# Patient Record
Sex: Female | Born: 1940 | Race: White | Hispanic: No | Marital: Married | Smoking: Never smoker
Health system: Southern US, Community
[De-identification: ages and names within clinical notes are randomized; demographics above are authoritative.]

## PROBLEM LIST (undated history)

## (undated) DIAGNOSIS — I480 Paroxysmal atrial fibrillation: Secondary | ICD-10-CM

## (undated) DIAGNOSIS — N189 Chronic kidney disease, unspecified: Secondary | ICD-10-CM

## (undated) DIAGNOSIS — K219 Gastro-esophageal reflux disease without esophagitis: Secondary | ICD-10-CM

## (undated) DIAGNOSIS — G4733 Obstructive sleep apnea (adult) (pediatric): Secondary | ICD-10-CM

## (undated) DIAGNOSIS — H269 Unspecified cataract: Secondary | ICD-10-CM

## (undated) DIAGNOSIS — C801 Malignant (primary) neoplasm, unspecified: Secondary | ICD-10-CM

## (undated) DIAGNOSIS — G629 Polyneuropathy, unspecified: Secondary | ICD-10-CM

## (undated) DIAGNOSIS — M1990 Unspecified osteoarthritis, unspecified site: Secondary | ICD-10-CM

## (undated) DIAGNOSIS — I7300 Raynaud's syndrome without gangrene: Secondary | ICD-10-CM

## (undated) DIAGNOSIS — I10 Essential (primary) hypertension: Secondary | ICD-10-CM

## (undated) HISTORY — PX: CATARACT EXTRACTION, BILATERAL: SHX1313

## (undated) HISTORY — DX: Polyneuropathy, unspecified: G62.9

## (undated) HISTORY — DX: Paroxysmal atrial fibrillation: I48.0

## (undated) HISTORY — DX: Unspecified cataract: H26.9

## (undated) HISTORY — DX: Essential (primary) hypertension: I10

## (undated) HISTORY — PX: ABDOMINAL HYSTERECTOMY: SHX81

## (undated) HISTORY — DX: Obstructive sleep apnea (adult) (pediatric): G47.33

## (undated) HISTORY — DX: Raynaud's syndrome without gangrene: I73.00

---

## 2010-11-18 HISTORY — PX: OTHER SURGICAL HISTORY: SHX169

## 2011-12-08 ENCOUNTER — Other Ambulatory Visit: Payer: Self-pay | Admitting: Diagnostic Neuroimaging

## 2011-12-08 DIAGNOSIS — M545 Low back pain: Secondary | ICD-10-CM

## 2011-12-08 DIAGNOSIS — R200 Anesthesia of skin: Secondary | ICD-10-CM

## 2011-12-08 DIAGNOSIS — G629 Polyneuropathy, unspecified: Secondary | ICD-10-CM

## 2011-12-20 ENCOUNTER — Ambulatory Visit
Admission: RE | Admit: 2011-12-20 | Discharge: 2011-12-20 | Disposition: A | Discharge status: Home / Self Care | Payer: PRIVATE HEALTH INSURANCE | Source: Ambulatory Visit | Attending: Diagnostic Neuroimaging | Admitting: Diagnostic Neuroimaging

## 2011-12-20 DIAGNOSIS — M545 Low back pain: Secondary | ICD-10-CM

## 2011-12-20 DIAGNOSIS — R200 Anesthesia of skin: Secondary | ICD-10-CM

## 2011-12-20 DIAGNOSIS — G629 Polyneuropathy, unspecified: Secondary | ICD-10-CM

## 2012-09-27 ENCOUNTER — Encounter: Payer: Self-pay | Admitting: Cardiovascular Disease

## 2012-09-27 ENCOUNTER — Ambulatory Visit (INDEPENDENT_AMBULATORY_CARE_PROVIDER_SITE_OTHER): Payer: Medicare Other | Admitting: Cardiovascular Disease

## 2012-09-27 VITALS — BP 160/80 | HR 47 | Ht 68.0 in | Wt 208.0 lb

## 2012-09-27 DIAGNOSIS — I480 Paroxysmal atrial fibrillation: Secondary | ICD-10-CM

## 2012-09-27 DIAGNOSIS — I4891 Unspecified atrial fibrillation: Secondary | ICD-10-CM

## 2012-09-27 DIAGNOSIS — I10 Essential (primary) hypertension: Secondary | ICD-10-CM

## 2012-09-27 MED ORDER — WARFARIN SODIUM 5 MG PO TABS: 5.0000 mg | ORAL_TABLET | Freq: Every day | ORAL | Status: DC

## 2012-09-27 NOTE — Patient Instructions (Addendum)
  We will see you back in follow up after the tests  Dr Allyson Sabal has ordered an echocardiogram and lexiscan myoview  INR on Tuesday 7/22  Start Coumadin 5mg  daily

## 2012-09-27 NOTE — Assessment & Plan Note (Signed)
The patient has had palpitations for several years. She's become a multipack process one or 2 years ago. Her A. Fib was found on Holter monitor. She gets occasional episodes of PAF several times a month with associated substernal chest pressure. She had a negative stress test several years ago and an ultrasound of her heart that showed mild pulmonary hypertension and left atrial dilatation. This was done at [facility] in 2011 I'm going to repeat a 2-D echo and alexic in my view. Her CHA2DS2VASC score is 3 making her a good candidate for oral anticoagulation.

## 2012-09-27 NOTE — Progress Notes (Signed)
09/27/2012 Ebony Mason   June 09, 1940  [id number]  Primary Physician Junius Roads, MD Primary Cardiologist: [name] Roseanne Reno   HPI:  Ms. Dorrance is a 72 year old married Caucasian female mother of 2, grandmother to 2 crit grandchildren who was referred to me for the courtesy of Dr. Dorann Lodge in [facility] for evaluation and treatment of paroxysmal atrial fibrillation. 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 Her Percocet to profile is positive only for hypertension. She's never had a heart attack or stroke. She has had pexed paroxysmal A. Fib for several years. She had negative stress test and echo at [facility] 2011 has been multi-for several years but apparently has decreased in frequency and severity of her A. Fib episodes. Her chart to score was thought that one and therefore she was treated only with aspirin. During these episodes she does get substernal chest pressure.   Current Outpatient Prescriptions  Medication Sig Dispense Refill  . aspirin 325 MG tablet Take 325 mg by mouth daily.      . Cyanocobalamin (VITAMIN B-12 IJ) Inject 1,000 mcg as directed every 30 (thirty) days.      . ferrous sulfate 325 (65 FE) MG tablet Take 325 mg by mouth daily with breakfast.      . gabapentin (NEURONTIN) 300 MG capsule Take 300 mg by mouth. 4 tablets daily      . indomethacin (INDOCIN) 25 MG capsule Take 25 mg by mouth 2 (two) times daily with a meal.      . meloxicam (MOBIC) 15 MG tablet Take 15 mg by mouth daily.      . metoprolol succinate (TOPROL-XL) 100 MG 24 hr tablet Take 50 mg by mouth daily.       . MULTAQ 400 MG tablet Take 400 mg by mouth daily.       Marland Kitchen omeprazole (PRILOSEC) 20 MG capsule Take 20 mg by mouth daily.       Marland Kitchen oxybutynin (DITROPAN-XL) 5 MG 24 hr tablet Take 5 mg by mouth daily.      Marland Kitchen triamterene-hydrochlorothiazide (MAXZIDE-25) 37.5-25 MG per tablet Take 1 tablet by mouth daily.        No current  facility-administered medications for this visit.    No Known Allergies  History   Social History  . Marital Status: Married    Spouse Name: N/A    Number of Children: N/A  . Years of Education: N/A   Occupational History  . Not on file.   Social History Main Topics  . Smoking status: Never Smoker   . Smokeless tobacco: Not on file  . Alcohol Use: Not on file  . Drug Use: No  . Sexually Active: Not on file   Other Topics Concern  . Not on file   Social History Narrative  . No narrative on file     Review of Systems: General: negative for chills, fever, night sweats or weight changes.  Cardiovascular: negative for chest pain, dyspnea on exertion, edema, orthopnea, palpitations, paroxysmal nocturnal dyspnea or shortness of breath Dermatological: negative for rash Respiratory: negative for cough or wheezing Urologic: negative for hematuria Abdominal: negative for nausea, vomiting, diarrhea, bright red blood per rectum, melena, or hematemesis Neurologic: negative for visual changes, syncope, or dizziness All other systems reviewed and are otherwise negative except as noted above.    Blood pressure 160/80, pulse 47, height 5\' 8"  (1.727 m), weight 208 lb (94.348 kg).  General appearance: alert and  no distress Neck: no adenopathy, no carotid bruit, no JVD, supple, symmetrical, trachea midline and thyroid not enlarged, symmetric, no tenderness/mass/nodules Lungs: clear to auscultation bilaterally Heart: regular rate and rhythm, S1, S2 normal, no murmur, click, rub or gallop Abdomen: soft, non-tender; bowel sounds normal; no masses,  no organomegaly Extremities: extremities normal, atraumatic, no cyanosis or edema Pulses: 2+ and symmetric  EKG sinus bradycardia at 47 without ST or T wave changes  ASSESSMENT AND PLAN:   Paroxysmal atrial fibrillation The patient has had palpitations for several years. She's become a multipack process one or 2 years ago. Her A. Fib was  found on Holter monitor. She gets occasional episodes of PAF several times a month with associated substernal chest pressure. She had a negative stress test several years ago and an ultrasound of her heart that showed mild pulmonary hypertension and left atrial dilatation. This was done at [facility] in 2011 I'm going to repeat a 2-D echo and alexic in my view. Her CHA2DS2VASC score is 3 making her a good candidate for oral anticoagulation.  Essential hypertension Under borderline control on a diuretic and beta blocker      [name] [facility], [facility] 09/27/2012 12:28 PM

## 2012-09-27 NOTE — Assessment & Plan Note (Signed)
Under borderline control on a diuretic and beta blocker

## 2012-10-02 ENCOUNTER — Ambulatory Visit (HOSPITAL_COMMUNITY)
Admission: RE | Admit: 2012-10-02 | Discharge: 2012-10-02 | Disposition: A | Discharge status: Home / Self Care | Payer: Medicare Other | Source: Ambulatory Visit | Attending: Cardiology | Admitting: Cardiology

## 2012-10-02 ENCOUNTER — Ambulatory Visit (INDEPENDENT_AMBULATORY_CARE_PROVIDER_SITE_OTHER): Payer: Medicare Other | Admitting: Pharmacist Clinician (PhC)/ Clinical Pharmacy Specialist

## 2012-10-02 DIAGNOSIS — Z7901 Long term (current) use of anticoagulants: Secondary | ICD-10-CM

## 2012-10-02 DIAGNOSIS — I480 Paroxysmal atrial fibrillation: Secondary | ICD-10-CM

## 2012-10-02 DIAGNOSIS — I10 Essential (primary) hypertension: Secondary | ICD-10-CM | POA: Insufficient documentation

## 2012-10-02 DIAGNOSIS — I4891 Unspecified atrial fibrillation: Secondary | ICD-10-CM

## 2012-10-02 NOTE — Progress Notes (Signed)
[facility]   2D echo completed 10/02/2012.   Cindy Romell Wolden, RDCS  

## 2012-10-03 ENCOUNTER — Telehealth: Payer: Self-pay | Admitting: Pharmacist Clinician (PhC)/ Clinical Pharmacy Specialist

## 2012-10-03 NOTE — Telephone Encounter (Signed)
Pt given directions for dosing until INR check on 7/31 with stress test

## 2012-10-03 NOTE — Telephone Encounter (Signed)
Patient not sure how to take her coumadin after next Monday.

## 2012-10-11 ENCOUNTER — Encounter: Payer: Self-pay | Admitting: *Deleted

## 2012-10-11 ENCOUNTER — Ambulatory Visit (HOSPITAL_COMMUNITY)
Admission: RE | Admit: 2012-10-11 | Discharge: 2012-10-11 | Disposition: A | Discharge status: Home / Self Care | Payer: Medicare Other | Source: Ambulatory Visit | Attending: Cardiovascular Disease | Admitting: Cardiovascular Disease

## 2012-10-11 ENCOUNTER — Ambulatory Visit (INDEPENDENT_AMBULATORY_CARE_PROVIDER_SITE_OTHER): Payer: Medicare Other | Admitting: Pharmacist Clinician (PhC)/ Clinical Pharmacy Specialist

## 2012-10-11 DIAGNOSIS — R0609 Other forms of dyspnea: Secondary | ICD-10-CM | POA: Insufficient documentation

## 2012-10-11 DIAGNOSIS — I10 Essential (primary) hypertension: Secondary | ICD-10-CM | POA: Insufficient documentation

## 2012-10-11 DIAGNOSIS — Z7901 Long term (current) use of anticoagulants: Secondary | ICD-10-CM

## 2012-10-11 DIAGNOSIS — I4891 Unspecified atrial fibrillation: Secondary | ICD-10-CM

## 2012-10-11 DIAGNOSIS — I480 Paroxysmal atrial fibrillation: Secondary | ICD-10-CM

## 2012-10-11 DIAGNOSIS — E669 Obesity, unspecified: Secondary | ICD-10-CM | POA: Insufficient documentation

## 2012-10-11 DIAGNOSIS — R079 Chest pain, unspecified: Secondary | ICD-10-CM | POA: Insufficient documentation

## 2012-10-11 DIAGNOSIS — R002 Palpitations: Secondary | ICD-10-CM | POA: Insufficient documentation

## 2012-10-11 DIAGNOSIS — R0989 Other specified symptoms and signs involving the circulatory and respiratory systems: Secondary | ICD-10-CM | POA: Insufficient documentation

## 2012-10-11 MED ORDER — AMINOPHYLLINE 25 MG/ML IV SOLN
75.0000 mg | Freq: Once | INTRAVENOUS | Status: AC
  Administered 2012-10-11: 75 mg via INTRAVENOUS

## 2012-10-11 MED ORDER — TECHNETIUM TC 99M SESTAMIBI GENERIC - CARDIOLITE
30.0000 | Freq: Once | INTRAVENOUS | Status: AC | PRN
  Administered 2012-10-11: 30 via INTRAVENOUS

## 2012-10-11 MED ORDER — TECHNETIUM TC 99M SESTAMIBI GENERIC - CARDIOLITE
10.0000 | Freq: Once | INTRAVENOUS | Status: AC | PRN
  Administered 2012-10-11: 10 via INTRAVENOUS

## 2012-10-11 MED ORDER — REGADENOSON 0.4 MG/5ML IV SOLN
0.4000 mg | Freq: Once | INTRAVENOUS | Status: AC
  Administered 2012-10-11: 0.4 mg via INTRAVENOUS

## 2012-10-11 NOTE — Procedures (Addendum)
[facility] [address] [place] [address] [phone]  Cardiology Nuclear Med Study  Ebony Mason is a 72 y.o. female     MRN : [id number]     DOB: 1940/11/13  Procedure Date: 10/11/2012  Nuclear Med Background Indication for Stress Test:  Evaluation for Ischemia History:  PAF Cardiac Risk Factors: Hypertension and Obesity  Symptoms:  Chest Pain, DOE and Palpitations   Nuclear Pre-Procedure Caffeine/Decaff Intake:  10:00pm NPO After: 8:00am   IV Site: R Forearm  IV 0.9% NS with Angio Cath:  22g  Chest Size (in):  N/A IV Started by: Emmit Pomfret, RN  Height: 5\' 8"  (1.727 m)  Cup Size: C  BMI:  Body mass index is 31.63 kg/(m^2). Weight:  208 lb (94.348 kg)   Tech Comments:  N/A    Nuclear Med Study 1 or 2 day study: 1 day  Stress Test Type:  Lexiscan  Order Authorizing Provider:  Nanetta Batty, MD   Resting Radionuclide: Technetium 39m Sestamibi  Resting Radionuclide Dose: 10.2 mCi   Stress Radionuclide:  Technetium 75m Sestamibi  Stress Radionuclide Dose: 32.1 mCi           Stress Protocol Rest HR:55 Stress HR:76  Rest BP: 155/77 Stress BP: 170/68  Exercise Time (min): n/a METS: n/a          Dose of Adenosine (mg):  n/a Dose of Lexiscan: 0.4 mg  Dose of Atropine (mg): n/a Dose of Dobutamine: n/a mcg/kg/min (at max HR)  Stress Test Technologist: Ernestene Mention, CCT Nuclear Technologist: Koren Shiver, CNMT   Rest Procedure:  Myocardial perfusion imaging was performed at rest 45 minutes following the intravenous administration of Technetium 39m Sestamibi. Stress Procedure:  The patient received IV Lexiscan 0.4 mg over 15-seconds.  Technetium 27m Sestamibi injected at 30-seconds.  Due to patient's shortness of breath, chest pressure and light-headedness, she was given IV Aminophylline 75 mg. Symptoms were resolved during recovery. There were no significant changes with Lexiscan.  Quantitative spect images  were obtained after a 45 minute delay.  Transient Ischemic Dilatation (Normal <1.22):  0.96 Lung/Heart Ratio (Normal <0.45):  0.40 QGS EDV:  86 ml QGS ESV:  26 ml LV Ejection Fraction: 70%  Signed by       Rest ECG: NSR - Normal EKG  Stress ECG: No significant change from baseline ECG  QPS Raw Data Images:  Normal; no motion artifact; normal heart/lung ratio. Stress Images:  Normal homogeneous uptake in all areas of the myocardium. Rest Images:  Normal homogeneous uptake in all areas of the myocardium. Subtraction (SDS):  No evidence of ischemia.  Impression Exercise Capacity:  Lexiscan with no exercise. BP Response:  Normal blood pressure response. Clinical Symptoms:  No significant symptoms noted. ECG Impression:  No significant ST segment change suggestive of ischemia. Comparison with Prior Nuclear Study: No images to compare  Overall Impression:  Normal stress nuclear study.  LV Wall Motion:  NL LV Function; NL Wall Motion   Runell Gess, MD  10/11/2012 6:31 PM

## 2012-10-15 ENCOUNTER — Encounter: Payer: Self-pay | Admitting: Cardiovascular Disease

## 2012-10-15 ENCOUNTER — Ambulatory Visit (INDEPENDENT_AMBULATORY_CARE_PROVIDER_SITE_OTHER): Payer: Medicare Other | Admitting: Cardiovascular Disease

## 2012-10-15 VITALS — BP 142/78 | HR 50 | Ht 68.0 in | Wt 200.0 lb

## 2012-10-15 DIAGNOSIS — R079 Chest pain, unspecified: Secondary | ICD-10-CM

## 2012-10-15 DIAGNOSIS — I480 Paroxysmal atrial fibrillation: Secondary | ICD-10-CM

## 2012-10-15 DIAGNOSIS — I4891 Unspecified atrial fibrillation: Secondary | ICD-10-CM

## 2012-10-15 NOTE — Assessment & Plan Note (Signed)
Patient had an episode of chest pain associated with PAF. She might be stress test which was entirely normal and an echo that was normal as well  Except for moderate left facial enlargement and mild to moderate TR.

## 2012-10-15 NOTE — Assessment & Plan Note (Signed)
On Coumadin anticoagulation. The patient wishes to change to another oral endocardial and because of ease of use and interactions with other medications. I will have our Pharm D discusse this with her.

## 2012-10-15 NOTE — Patient Instructions (Addendum)
Xarelto 20 mg daily or Eliquis 5mg  twice a day  Your physician wants you to follow-up in: 6 months.  You will receive a reminder letter in the mail two months in advance. If you don't receive a letter, please call our office to schedule the follow-up appointment.

## 2012-10-15 NOTE — Progress Notes (Signed)
10/15/2012 [name]   1940-12-06  [id number]  Primary Physician Junius Roads, MD Primary Cardiologist: [name] Ebony Mason   HPI:  Ebony Mason returns today for followup. She was recently seen by myself on 09/27/12 after being referred by Dr. Dorann Lodge for the for evaluation of paroxysmal atrial fibrillation. She did describe an episode of chest pain with these episodes and therefore had a Myoview stress test was normal a 2-D echocardiogram which was normal as well except for moderate left atrial lodgment and mild to moderate tricuspid regurgitation. She was started on Coumadin anticoagulation because of her CHADS2-VASc score which was 3. She  wishes to explore novel oral anticoagulants.   Current Outpatient Prescriptions  Medication Sig Dispense Refill  . Cyanocobalamin (VITAMIN B-12 IJ) Inject 1,000 mcg as directed every 30 (thirty) days.      . ferrous sulfate 325 (65 FE) MG tablet Take 325 mg by mouth daily with breakfast. OVER THE COUNTER      . gabapentin (NEURONTIN) 300 MG capsule Take 300 mg by mouth. 4 tablets daily      . meloxicam (MOBIC) 15 MG tablet Take 15 mg by mouth daily.      . metoprolol succinate (TOPROL-XL) 100 MG 24 hr tablet Take 50 mg by mouth daily.       . MULTAQ 400 MG tablet Take 400 mg by mouth daily.       Marland Kitchen omeprazole (PRILOSEC) 20 MG capsule Take 20 mg by mouth daily.       Marland Kitchen oxybutynin (DITROPAN-XL) 5 MG 24 hr tablet Take 5 mg by mouth daily.      Marland Kitchen oxyCODONE-acetaminophen (PERCOCET) 10-325 MG per tablet As directed      . predniSONE (STERAPRED UNI-PAK) 5 MG TABS tablet As directed      . triamterene-hydrochlorothiazide (MAXZIDE-25) 37.5-25 MG per tablet Take 1 tablet by mouth daily.       Marland Kitchen warfarin (COUMADIN) 5 MG tablet Take 1 tablet (5 mg total) by mouth daily. Or as directed  45 tablet  3   No current facility-administered medications for this visit.    No Known Allergies  History   Social History  . Marital Status:  Married    Spouse Name: N/A    Number of Children: N/A  . Years of Education: N/A   Occupational History  . Not on file.   Social History Main Topics  . Smoking status: Never Smoker   . Smokeless tobacco: Not on file  . Alcohol Use: Not on file  . Drug Use: No  . Sexually Active: Not on file   Other Topics Concern  . Not on file   Social History Narrative  . No narrative on file     Review of Systems: General: negative for chills, fever, night sweats or weight changes.  Cardiovascular: negative for chest pain, dyspnea on exertion, edema, orthopnea, palpitations, paroxysmal nocturnal dyspnea or shortness of breath Dermatological: negative for rash Respiratory: negative for cough or wheezing Urologic: negative for hematuria Abdominal: negative for nausea, vomiting, diarrhea, bright red blood per rectum, melena, or hematemesis Neurologic: negative for visual changes, syncope, or dizziness All other systems reviewed and are otherwise negative except as noted above.    Blood pressure 142/78, pulse 50, height 5\' 8"  (1.727 m), weight 200 lb (90.719 kg), SpO2 97.00%.  General appearance: alert and no distress Neck: no adenopathy, no carotid bruit, no JVD, supple, symmetrical, trachea midline and thyroid not enlarged, symmetric, no tenderness/mass/nodules Lungs:  clear to auscultation bilaterally Heart: regular rate and rhythm, S1, S2 normal, no murmur, click, rub or gallop Extremities: extremities normal, atraumatic, no cyanosis or edema  EKG not performed today  ASSESSMENT AND PLAN:   Paroxysmal atrial fibrillation On Coumadin anticoagulation. The patient wishes to change to another oral endocardial and because of ease of use and interactions with other medications. I will have our Pharm D discusse this with her.  Chest pain Patient had an episode of chest pain associated with PAF. She might be stress test which was entirely normal and an echo that was normal as well  Except  for moderate left facial enlargement and mild to moderate TR.      [name] FACP,FACC,FAHA, [facility] 10/15/2012 12:28 PM

## 2012-10-16 ENCOUNTER — Telehealth: Payer: Self-pay | Admitting: Cardiovascular Disease

## 2012-10-16 NOTE — Telephone Encounter (Signed)
Having an EGD and Colonoscopy-need to know if they can stop her Coumadin? Just started it on July 17th-she also tested positive for blood in her stool.

## 2012-10-16 NOTE — Telephone Encounter (Signed)
Message forwarded to Dr. Berry for review and further instructions  

## 2012-10-18 ENCOUNTER — Ambulatory Visit: Payer: Medicare Other | Admitting: Cardiovascular Disease

## 2012-10-19 NOTE — Telephone Encounter (Signed)
Can hold coumadin for 5 days prior to EGD

## 2012-10-22 NOTE — Telephone Encounter (Signed)
Clearance letter was faxed.  Dr Allyson Sabal said that plavix could be held 5 days prior to procedure

## 2012-12-27 ENCOUNTER — Telehealth: Payer: Self-pay | Admitting: Cardiovascular Disease

## 2013-02-14 ENCOUNTER — Encounter: Payer: Self-pay | Admitting: Cardiovascular Disease

## 2013-02-15 ENCOUNTER — Ambulatory Visit (INDEPENDENT_AMBULATORY_CARE_PROVIDER_SITE_OTHER): Payer: Medicare Other | Admitting: Cardiovascular Disease

## 2013-02-15 ENCOUNTER — Encounter: Payer: Self-pay | Admitting: Cardiovascular Disease

## 2013-02-15 VITALS — BP 150/80 | HR 56 | Ht 68.0 in | Wt 204.0 lb

## 2013-02-15 DIAGNOSIS — I10 Essential (primary) hypertension: Secondary | ICD-10-CM

## 2013-02-15 DIAGNOSIS — G629 Polyneuropathy, unspecified: Secondary | ICD-10-CM | POA: Insufficient documentation

## 2013-02-15 DIAGNOSIS — I480 Paroxysmal atrial fibrillation: Secondary | ICD-10-CM

## 2013-02-15 DIAGNOSIS — G609 Hereditary and idiopathic neuropathy, unspecified: Secondary | ICD-10-CM

## 2013-02-15 DIAGNOSIS — I4891 Unspecified atrial fibrillation: Secondary | ICD-10-CM

## 2013-02-15 NOTE — Assessment & Plan Note (Signed)
She complains of numbness and coldness of her feet. She really denies claudication. She has 2+ pedal pulses. I doubt her symptoms are vascular

## 2013-02-15 NOTE — Patient Instructions (Signed)
Your physician wants you to follow-up in: 1 year with Dr Berry. You will receive a reminder letter in the mail two months in advance. If you don't receive a letter, please call our office to schedule the follow-up appointment.  

## 2013-02-15 NOTE — Progress Notes (Signed)
02/15/2013 Ebony Mason   February 12, 1941  [id number]  Primary Physician Ebony Roads, MD Primary Cardiologist: Ebony Gess MD Ebony Mason   HPI:  Ms. Ebony Mason returns today for followup. She was recently seen by myself on 09/27/12 after being referred by Dr. Dorann Mason for the for evaluation of paroxysmal atrial fibrillation. She did describe an episode of chest pain with these episodes and therefore had a Myoview stress test was normal a 2-D echocardiogram which was normal as well except for moderate left atrial lodgment and mild to moderate tricuspid regurgitation. She was started on Coumadin anticoagulation because of her CHADS2-VASc score which was 3. She wishes to explore novel oral anticoagulants although the cough apparently is prohibitive. She is otherwise asymptomatic. She was referred back because of feelings of numbness and coldness of her feet. I believe this is related to peripheral neuropathy. She has 2+ pedal pulses.    Current Outpatient Prescriptions  Medication Sig Dispense Refill  . Cyanocobalamin (VITAMIN B-12 IJ) Inject 1,000 mcg as directed every 30 (thirty) days.      Marland Kitchen gabapentin (NEURONTIN) 300 MG capsule Take 300 mg by mouth. 4 tablets daily      . meloxicam (MOBIC) 15 MG tablet Take 15 mg by mouth daily.      . metoprolol succinate (TOPROL-XL) 100 MG 24 hr tablet Take 50 mg by mouth daily.       . MULTAQ 400 MG tablet Take 400 mg by mouth daily.       Marland Kitchen omeprazole (PRILOSEC) 20 MG capsule Take 20 mg by mouth daily.       Marland Kitchen oxybutynin (DITROPAN-XL) 5 MG 24 hr tablet Take 5 mg by mouth daily.      Marland Kitchen triamterene-hydrochlorothiazide (MAXZIDE-25) 37.5-25 MG per tablet Take 1 tablet by mouth daily.       Marland Kitchen warfarin (COUMADIN) 5 MG tablet Take 1 tablet (5 mg total) by mouth daily. Or as directed  45 tablet  3   No current facility-administered medications for this visit.    No Known Allergies  History   Social History  . Marital Status: Married   Spouse Name: N/A    Number of Children: N/A  . Years of Education: N/A   Occupational History  . Not on file.   Social History Main Topics  . Smoking status: Never Smoker   . Smokeless tobacco: Not on file  . Alcohol Use: Not on file  . Drug Use: No  . Sexual Activity: Not on file   Other Topics Concern  . Not on file   Social History Narrative  . No narrative on file     Review of Systems: General: negative for chills, fever, night sweats or weight changes.  Cardiovascular: negative for chest pain, dyspnea on exertion, edema, orthopnea, palpitations, paroxysmal nocturnal dyspnea or shortness of breath Dermatological: negative for rash Respiratory: negative for cough or wheezing Urologic: negative for hematuria Abdominal: negative for nausea, vomiting, diarrhea, bright red blood per rectum, melena, or hematemesis Neurologic: negative for visual changes, syncope, or dizziness All other systems reviewed and are otherwise negative except as noted above.    Blood pressure 150/80, pulse 56, height 5\' 8"  (1.727 m), weight 204 lb (92.534 kg).  General appearance: alert and no distress Neck: no adenopathy, no carotid bruit, no JVD, supple, symmetrical, trachea midline and thyroid not enlarged, symmetric, no tenderness/mass/nodules Lungs: clear to auscultation bilaterally Heart: regular rate and rhythm, S1, S2 normal, no murmur, click, rub or gallop  Extremities: extremities normal, atraumatic, no cyanosis or edema Pulses: 2+ and symmetric  EKG not performed today  ASSESSMENT AND PLAN:   Paroxysmal atrial fibrillation History of paroxysmal atrial fibrillation on Coumadin anticoagulation issue normal 2-D echo Myoview stress test. She is currently wearing a monitor put on by her primary care physician. She is otherwise asymptomatic.she is on Multaq as an antiarrhythmic agent  Peripheral neuropathy She complains of numbness and coldness of her feet. She really denies claudication.  She has 2+ pedal pulses. I doubt her symptoms are vascular  Hypertension Controlled on current medications      Ebony Gess MD [facility], [facility] 02/15/2013 10:26 AM

## 2013-02-15 NOTE — Assessment & Plan Note (Signed)
Controlled on current medications 

## 2013-02-15 NOTE — Assessment & Plan Note (Addendum)
History of paroxysmal atrial fibrillation on Coumadin anticoagulation issue normal 2-D echo Myoview stress test. She is currently wearing a monitor put on by her primary care physician. She is otherwise asymptomatic.she is on Multaq as an antiarrhythmic agent

## 2013-07-05 ENCOUNTER — Other Ambulatory Visit (HOSPITAL_COMMUNITY): Payer: Self-pay | Admitting: Podiatry

## 2013-07-05 DIAGNOSIS — G8929 Other chronic pain: Secondary | ICD-10-CM

## 2013-07-05 DIAGNOSIS — M79673 Pain in unspecified foot: Principal | ICD-10-CM

## 2013-07-16 ENCOUNTER — Ambulatory Visit (HOSPITAL_COMMUNITY)
Admission: RE | Admit: 2013-07-16 | Discharge: 2013-07-16 | Disposition: A | Discharge status: Home / Self Care | Payer: Medicare Other | Source: Ambulatory Visit | Attending: Podiatry | Admitting: Podiatry

## 2013-07-16 DIAGNOSIS — M79609 Pain in unspecified limb: Secondary | ICD-10-CM | POA: Insufficient documentation

## 2013-07-16 DIAGNOSIS — I10 Essential (primary) hypertension: Secondary | ICD-10-CM

## 2013-07-16 DIAGNOSIS — G8929 Other chronic pain: Secondary | ICD-10-CM

## 2013-07-16 DIAGNOSIS — G629 Polyneuropathy, unspecified: Secondary | ICD-10-CM

## 2013-07-16 DIAGNOSIS — G609 Hereditary and idiopathic neuropathy, unspecified: Secondary | ICD-10-CM | POA: Insufficient documentation

## 2013-07-16 DIAGNOSIS — M79673 Pain in unspecified foot: Secondary | ICD-10-CM

## 2013-07-16 NOTE — Progress Notes (Signed)
VASCULAR LAB PRELIMINARY  ARTERIAL  ABI completed:    RIGHT    LEFT    PRESSURE WAVEFORM  PRESSURE WAVEFORM  BRACHIAL 143 triphasic BRACHIAL 145 triphasic  DP   DP    AT 151 biphasic AT 148 biphasic  PT 159 biphasic PT 163 biphasic  PER   PER    GREAT TOE 144 NA GREAT TOE 130 NA    RIGHT LEFT  ABI >1.0 >1.0   Right:  PPG waveforms in second, third, fourth, and fifth digits indicate adequate perfusion.  Left:  PPG waveforms in the fourth toe indicate adequate perfusion.  PPG waveforms in the second, third, and fifth toes indicate inadequate perfusion.  Charlaine Dalton, RVT 07/16/2013, 12:36 PM

## 2013-07-23 NOTE — Telephone Encounter (Signed)
Close encounter 

## 2013-08-26 LAB — PROTIME-INR: INR: 2.9 — AB (ref 0.9–1.1)

## 2014-08-21 ENCOUNTER — Encounter: Payer: Self-pay | Admitting: Neurology

## 2014-08-21 ENCOUNTER — Ambulatory Visit (INDEPENDENT_AMBULATORY_CARE_PROVIDER_SITE_OTHER): Payer: Medicare Other | Admitting: Neurology

## 2014-08-21 VITALS — BP 145/91 | HR 69 | Ht 68.0 in | Wt 210.0 lb

## 2014-08-21 DIAGNOSIS — R202 Paresthesia of skin: Secondary | ICD-10-CM | POA: Diagnosis not present

## 2014-08-21 DIAGNOSIS — M545 Low back pain, unspecified: Secondary | ICD-10-CM

## 2014-08-21 MED ORDER — DULOXETINE HCL 30 MG PO CPEP: ORAL_CAPSULE | ORAL | Status: DC

## 2014-08-21 NOTE — Progress Notes (Signed)
PATIENT: Ebony Mason DOB: 1940-07-28  Chief Complaint  Patient presents with  . Peripheral Neuropathy    She is here with her husband, Luciana Axe, today.  She started having pain and burning in bilateral feet in 2012.  She was started on gabapentin and her dose has been increased mulitple times.  Her current dose is 600mg  3-4 times daily.  She also tried Lyrica but it was not helpful.  She has been evaluated by several other providers and she has brought a list of previous therapies and tests.    HISTORICAL  NITI LEISURE is a 74 years old right-handed female, referred by her primary care physician Dr. Donavan Burnet for evaluation of bilateral feet paresthesia  She had a history of hypertension, hyperlipidemia, atrial fibrillation, taking amiodarone warfarin since July 2014, also had a history of B12 deficiency, monthly B12 intramuscular injection since February 2013.  She began to notice bilateral plantar feet paresthesia burning stinging pain since 2012, getting worse after bearing weight, over the years, had slow progression, seems to involving top of her toes some, but below ankle level  She also complains of midline low back pain, no significant shooting pain, her bilateral feet pain, and the low back pain getting worse after walking, even about 100 feet towards her mailbox  Over the years, she has seen different specialists, neurologist, podiatrist,  was given prescription, titrating dose of gabapentin, currently taking 600 mg 1-2 tablets each day, previously she also tried Lyrica, acupuncture, physical therapy, with limited help,  She was evaluated by neurosurgeon December 2015, no surgical intervention is indicated, recent bilateral plantar feet injection Jul 22 2014 by podiatrist failed to provide prolonged relief.  We have reviewed MRI of lumbar spine in 2013, mild multilevel degenerative disc disease, no significant foraminal or canal stenosis,  She denies bilateral hands  paresthesia, gait difficulty due to low back pain, feet pain, no bowel bladder incontinence, no persistent muscle weakness.  REVIEW OF SYSTEMS: Full 14 system review of systems performed and notable only for fatigue, shortness of breath, cramps, numbness, weakness, ringing ears, decreased energy  ALLERGIES: No Known Allergies  HOME MEDICATIONS: Current Outpatient Prescriptions  Medication Sig Dispense Refill  . amiodarone (PACERONE) 200 MG tablet Take 200 mg by mouth.    . Cyanocobalamin (VITAMIN B-12 IJ) Inject 1,000 mcg as directed every 30 (thirty) days.    Marland Kitchen gabapentin (NEURONTIN) 600 MG tablet Take 600 mg by mouth. Taking 3-4 times daily.    Marland Kitchen HYDROcodone-acetaminophen (NORCO/VICODIN) 5-325 MG per tablet Take by mouth.    . lovastatin (MEVACOR) 40 MG tablet TAKE ONE TABLET EVERY EVENING FOR CHOLESTEROL  3  . metoprolol succinate (TOPROL-XL) 100 MG 24 hr tablet Take 50 mg by mouth daily.     Marland Kitchen omeprazole (PRILOSEC) 20 MG capsule Take 20 mg by mouth daily.     Marland Kitchen oxybutynin (DITROPAN-XL) 5 MG 24 hr tablet Take 5 mg by mouth daily.    Marland Kitchen triamterene-hydrochlorothiazide (MAXZIDE-25) 37.5-25 MG per tablet Take 1 tablet by mouth daily.     Marland Kitchen warfarin (COUMADIN) 5 MG tablet Take 1 tablet (5 mg total) by mouth daily. Or as directed 45 tablet 3   No current facility-administered medications for this visit.    PAST MEDICAL HISTORY: Past Medical History  Diagnosis Date  . Paroxysmal atrial fibrillation   . Hypertension   . Peripheral neuropathy     PAST SURGICAL HISTORY: Past Surgical History  Procedure Laterality Date  . Lower extremity  venous doppler  11/18/2010    No evidence of thrombus or thrombophlebitis. No venous insufficiency was noted.  . Abdominal hysterectomy      FAMILY HISTORY: Family History  Problem Relation Age of Onset  . Liver cancer Mother   . Diabetes Maternal Grandfather   . Alzheimer's disease Father     SOCIAL HISTORY:  History   Social History  .  Marital Status: Married    Spouse Name: N/A  . Number of Children: 2  . Years of Education: 13   Occupational History  . Retired    Social History Main Topics  . Smoking status: Never Smoker   . Smokeless tobacco: Not on file  . Alcohol Use: No  . Drug Use: No  . Sexual Activity: Not on file   Other Topics Concern  . Not on file   Social History Narrative   Lives at home with her husband.   Right-handed.   1 cup caffeine per day.    PHYSICAL EXAM   Filed Vitals:   08/21/14 1020  BP: 145/91  Pulse: 69  Height: 5\' 8"  (1.727 m)  Weight: 210 lb (95.255 kg)    Not recorded      Body mass index is 31.94 kg/(m^2).  PHYSICAL EXAMNIATION:  Gen: NAD, conversant, well nourised, obese, well groomed                     Cardiovascular: Regular rate rhythm, no peripheral edema, warm, nontender. Eyes: Conjunctivae clear without exudates or hemorrhage Neck: Supple, no carotid bruise. Pulmonary: Clear to auscultation bilaterally   NEUROLOGICAL EXAM:  MENTAL STATUS: Speech:    Speech is normal; fluent and spontaneous with normal comprehension.  Cognition:    The patient is oriented to person, place, and time;     recent and remote memory intact;     language fluent;     normal attention, concentration,     fund of knowledge.  CRANIAL NERVES: CN II: Visual fields are full to confrontation. Fundoscopic exam is normal with sharp discs and no vascular changes. Pupil are equal round reactive to light CN III, IV, VI: extraocular movement are normal. No ptosis. CN V: Facial sensation is intact to pinprick in all 3 divisions bilaterally. Corneal responses are intact.  CN VII: Face is symmetric with normal eye closure and smile. CN VIII: Hearing is normal to rubbing fingers CN IX, X: Palate elevates symmetrically. Phonation is normal. CN XI: Head turning and shoulder shrug are intact CN XII: Tongue is midline with normal movements and no atrophy.  MOTOR: There is no pronator  drift of out-stretched arms. Muscle bulk and tone are normal. Muscle strength is normal.  REFLEXES: Reflexes are 2+ and symmetric at the biceps, triceps, knees, and trace at ankles. Plantar responses are flexor.  SENSORY: Length dependent decreased pinprick to midshin, and absent vibration sense to bilateral knee level, proprioception are intact in fingers and toes.  COORDINATION: Rapid alternating movements and fine finger movements are intact. There is no dysmetria on finger-to-nose and heel-knee-shin. There are no abnormal or extraneous movements.   GAIT/STANCE: Mild antalgic gait, mild difficulty perform tiptoe, heel, tandem walking Romberg is absent.   DIAGNOSTIC DATA (LABS, IMAGING, TESTING) - I reviewed patient records, labs, notes, testing and imaging myself where available.  No results found for: WBC, HGB, HCT, MCV, PLT No results found for: NA, K, CL, CO2, GLUCOSE, BUN, CREATININE, CALCIUM, PROT, ALBUMIN, AST, ALT, ALKPHOS, BILITOT, GFRNONAA, GFRAA No results found  for: CHOL, HDL, LDLCALC, LDLDIRECT, TRIG, CHOLHDL No results found for: HGBA1C No results found for: VITAMINB12 No results found for: TSH    ASSESSMENT AND PLAN  LAURIANN MILILLO is a 74 y.o. female with history of hypertension, hyperlipidemia, atrial fibrillation, on Coumadin treatment, presenting with bilateral feet paresthesia since 2012, slow progression, mild length dependent sensory changes, preserved ankle reflexes. 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 Low back pain, no signs of lumbar radiculopathy, MRI of lumbar in 2013 showed no significant canal or foraminal stenosis, I have suggested her back stretching exercise, hot compression 4.  Cymbalta 60 mg daily    Marcial Pacas, M.D. Ph.D.  [facility] [address] [place], [place] [address] Ph: [phone] Fax: [phone]

## 2014-09-08 ENCOUNTER — Ambulatory Visit (INDEPENDENT_AMBULATORY_CARE_PROVIDER_SITE_OTHER): Payer: Medicare Other | Admitting: Neurology

## 2014-09-08 ENCOUNTER — Ambulatory Visit (INDEPENDENT_AMBULATORY_CARE_PROVIDER_SITE_OTHER): Payer: Self-pay | Admitting: Neurology

## 2014-09-08 DIAGNOSIS — R202 Paresthesia of skin: Secondary | ICD-10-CM

## 2014-09-08 DIAGNOSIS — M545 Low back pain, unspecified: Secondary | ICD-10-CM | POA: Insufficient documentation

## 2014-09-08 DIAGNOSIS — G629 Polyneuropathy, unspecified: Secondary | ICD-10-CM

## 2014-09-08 DIAGNOSIS — Z0289 Encounter for other administrative examinations: Secondary | ICD-10-CM

## 2014-09-08 NOTE — Progress Notes (Signed)
EMG/nerve conduction today showed mild axonal peripheral neuropathy, there was no evidence of right lumbar radiculopathy

## 2014-09-08 NOTE — Procedures (Signed)
   NCS (NERVE CONDUCTION STUDY) WITH EMG (ELECTROMYOGRAPHY) REPORT   STUDY DATE: September 08 2014 PATIENT NAME: Ebony Mason DOB: 02/08/1941 MRN: [id number]    TECHNOLOGIST: Laretta Alstrom ELECTROMYOGRAPHER: Marcial Pacas M.D.  CLINICAL INFORMATION:  74 year old female, with few years history of bilateral feet paresthesia. History of atrial fibrillation, on chronic Coumadin treatment  FINDINGS: NERVE CONDUCTION STUDY: Bilateral peroneal sensory responses were absent. Bilateral peroneal to EDB showed severely decreased C map amplitude, with normal distal latency, conduction velocity, and F-wave latencies. Right tibial motor response showed normal C map amplitude, distal latency, conduction velocity, F wave latency. Left tibial motor response showed mildly decreased to C map amplitude, with normal distal latency, conduction velocity, and F-wave latency.  Bilateral tibial H reflexes were absent  Right median ulnar sensory and motor responses were normal.  NEEDLE ELECTROMYOGRAPHY: Selective needle examination was performed at right lower extremity muscles, I do not perform needle examination of right lumbosacral paraspinals, because she is taking Coumadin  Needle examination of right tibialis anterior, tibialis posterior, peroneal longus, medial gastrocnemius, vastus lateralis was normal.  IMPRESSION:   This is a mild abnormal study. 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 There was no evidence of right lumbosacral radiculopathy.   INTERPRETING PHYSICIAN:   Marcial Pacas M.D. Ph.D. [facility] [address], [place], [place] [address] [phone]

## 2014-09-10 ENCOUNTER — Telehealth: Payer: Self-pay | Admitting: *Deleted

## 2014-09-10 NOTE — Telephone Encounter (Signed)
Labs received from [facility] (collected on 08/29/14):  TSH: 2.880uIU/ml  WBC: 7.6K/uL  HGB: 13.4g/dL  CMP: BUN 25mg /dL           Creatinine 1.47mg /dL  Triglyceride 169mg .dL  LDL 89mg /dL  HGB A1C 6.0%  Estimated average glucose: 126mg /dL  CRP: 5mg /dL  ANA negative

## 2014-09-23 ENCOUNTER — Ambulatory Visit: Payer: Medicare Other | Admitting: Neurology

## 2014-11-10 ENCOUNTER — Telehealth: Payer: Self-pay | Admitting: *Deleted

## 2014-11-10 NOTE — Telephone Encounter (Signed)
Spoke with patient and family - rescheduled her appt.

## 2014-11-18 ENCOUNTER — Ambulatory Visit: Payer: Medicare Other | Admitting: Neurology

## 2014-12-01 ENCOUNTER — Ambulatory Visit: Payer: Self-pay | Admitting: Neurology

## 2014-12-02 ENCOUNTER — Ambulatory Visit: Payer: Self-pay | Admitting: Neurology

## 2015-03-02 ENCOUNTER — Ambulatory Visit: Payer: Medicare Other | Admitting: Neurology

## 2015-03-17 ENCOUNTER — Encounter (INDEPENDENT_AMBULATORY_CARE_PROVIDER_SITE_OTHER): Payer: Self-pay

## 2015-03-17 ENCOUNTER — Ambulatory Visit (INDEPENDENT_AMBULATORY_CARE_PROVIDER_SITE_OTHER): Payer: Medicare Other | Admitting: Neurology

## 2015-03-17 ENCOUNTER — Encounter: Payer: Self-pay | Admitting: Neurology

## 2015-03-17 VITALS — BP 148/88 | HR 72 | Ht 68.0 in | Wt 208.0 lb

## 2015-03-17 DIAGNOSIS — R202 Paresthesia of skin: Secondary | ICD-10-CM

## 2015-03-17 DIAGNOSIS — R269 Unspecified abnormalities of gait and mobility: Secondary | ICD-10-CM | POA: Diagnosis not present

## 2015-03-17 DIAGNOSIS — M545 Low back pain, unspecified: Secondary | ICD-10-CM

## 2015-03-17 MED ORDER — OXCARBAZEPINE 150 MG PO TABS: 150.0000 mg | ORAL_TABLET | Freq: Two times a day (BID) | ORAL | Status: DC

## 2015-03-17 NOTE — Progress Notes (Signed)
Chief Complaint  Patient presents with  . Peripheral Neuropathy    She is still experiencing the same burning and tingling in her bilateral feet.  Her toes have been turning red.  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    . Back Pain    Feels her back pain is better.  She was given an IM steroid injection by her PCP.  She never tried the Cymbalta that was prescribed.  . Gait Problem    She has noticed a worsening in her gait.  She feels unsteady when walking and has had several falls.  Toy Care in Hands    She has been having intermittent jerking in her hands.  Says she goes through spells when it is worse.  It does not happen everyday.       PATIENT: Ebony Mason DOB: 1940/12/23  Chief Complaint  Patient presents with  . Peripheral Neuropathy    She is still experiencing the same burning and tingling in her bilateral feet.  Her toes have been turning red.  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    . Back Pain    Feels her back pain is better.  She was given an IM steroid injection by her PCP.  She never tried the Cymbalta that was prescribed.  . Gait Problem    She has noticed a worsening in her gait.  She feels unsteady when walking and has had several falls.  Toy Care in Hands    She has been having intermittent jerking in her hands.  Says she goes through spells when it is worse.  It does not happen everyday.     HISTORICAL  Ebony Mason is a 75 years old right-handed female, referred by her primary care physician Dr. Donavan Burnet for evaluation of bilateral feet paresthesia  She had a history of hypertension, hyperlipidemia, atrial fibrillation, taking amiodarone, start Eliquis Feb 25 2015, also had a history of B12 deficiency, monthly B12 intramuscular injection since February 2013.  She began to notice bilateral plantar feet paresthesia burning stinging pain since 2012, getting worse after bearing weight, over the years,  had slow progression, seems to involving top of her toes some, but below ankle level  She also complains of midline low back pain, no significant shooting pain, her bilateral feet pain, and the low back pain getting worse after walking, even about 100 feet towards her mailbox  Over the years, she has seen different specialists, neurologist, podiatrist,  was given prescription, titrating dose of gabapentin, currently taking 600 mg 1-2 tablets each day, previously she also tried Lyrica, acupuncture, physical therapy, with limited help,  She was evaluated by neurosurgeon December 2015, no surgical intervention is indicated, recent bilateral plantar feet injection Jul 22 2014 by podiatrist failed to provide prolonged relief.  We have reviewed MRI of lumbar spine in 2013, mild multilevel degenerative disc disease, no significant foraminal or canal stenosis,  She denies bilateral hands paresthesia, gait difficulty due to low back pain, feet pain, no bowel bladder incontinence, no persistent muscle weakness.  UPDATE Mar 17 2015: She denies neck pain, denies bowel and bladder incontinence, she continues to have bilateral feet paresthesia, burning pain, mild gait difficulty. She was recently diagnosed with Reynold's phenomenon.   REVIEW OF SYSTEMS: Full 14 system review of systems performed and notable only for fatigue, ringing ears, shortness of breath, leg swelling, constipation, apnea, frequent awakening, daytime sleepiness, back pain, muscle cramps, bruise  easily, dizziness, numbness  ALLERGIES: No Known Allergies  HOME MEDICATIONS: Current Outpatient Prescriptions  Medication Sig Dispense Refill  . amiodarone (PACERONE) 200 MG tablet Take 200 mg by mouth.    . Cyanocobalamin (VITAMIN B-12 IJ) Inject 1,000 mcg as directed every 30 (thirty) days.    Marland Kitchen ELIQUIS 5 MG TABS tablet TAKE ONE TABLET BY MOUTH TWICE DAILY FOR THINNING BLOOD  5  . gabapentin (NEURONTIN) 600 MG tablet Take 600 mg by mouth.  Taking 3-4 times daily.    Marland Kitchen HYDROcodone-acetaminophen (NORCO/VICODIN) 5-325 MG per tablet Take by mouth.    . lovastatin (MEVACOR) 40 MG tablet TAKE ONE TABLET EVERY EVENING FOR CHOLESTEROL  3  . metoprolol succinate (TOPROL-XL) 100 MG 24 hr tablet Take 50 mg by mouth daily.     Marland Kitchen omeprazole (PRILOSEC) 20 MG capsule Take 20 mg by mouth daily.     Marland Kitchen oxybutynin (DITROPAN-XL) 5 MG 24 hr tablet Take 5 mg by mouth daily.    Marland Kitchen triamterene-hydrochlorothiazide (MAXZIDE-25) 37.5-25 MG per tablet Take 1 tablet by mouth daily.      No current facility-administered medications for this visit.    PAST MEDICAL HISTORY: Past Medical History  Diagnosis Date  . Paroxysmal atrial fibrillation (H[facility])   . Hypertension   . Peripheral neuropathy ([place])     PAST SURGICAL HISTORY: Past Surgical History  Procedure Laterality Date  . Lower extremity venous doppler  11/18/2010    No evidence of thrombus or thrombophlebitis. No venous insufficiency was noted.  . Abdominal hysterectomy      FAMILY HISTORY: Family History  Problem Relation Age of Onset  . Liver cancer Mother   . Diabetes Maternal Grandfather   . Alzheimer's disease Father     SOCIAL HISTORY:  Social History   Social History  . Marital Status: Married    Spouse Name: N/A  . Number of Children: 2  . Years of Education: 13   Occupational History  . Retired    Social History Main Topics  . Smoking status: Never Smoker   . Smokeless tobacco: Not on file  . Alcohol Use: No  . Drug Use: No  . Sexual Activity: Not on file   Other Topics Concern  . Not on file   Social History Narrative   Lives at home with her husband.   Right-handed.   1 cup caffeine per day.    PHYSICAL EXAM   Filed Vitals:   03/17/15 1502  BP: 148/88  Pulse: 72  Height: 5\' 8"  (1.727 m)  Weight: 208 lb (94.348 kg)    Not recorded      Body mass index is 31.63 kg/(m^2).  PHYSICAL EXAMNIATION:  Gen: NAD, conversant, well nourised, obese, well  groomed                     Cardiovascular: Regular rate rhythm, no peripheral edema, warm, nontender. Eyes: Conjunctivae clear without exudates or hemorrhage Neck: Supple, no carotid bruise. Pulmonary: Clear to auscultation bilaterally   NEUROLOGICAL EXAM:  MENTAL STATUS: Speech:    Speech is normal; fluent and spontaneous with normal comprehension.  Cognition:    The patient is oriented to person, place, and time;     recent and remote memory intact;     language fluent;     normal attention, concentration,     fund of knowledge.  CRANIAL NERVES: CN II: Visual fields are full to confrontation. Fundoscopic exam is normal with sharp discs and no vascular changes.  Pupil are equal round reactive to light CN III, IV, VI: extraocular movement are normal. No ptosis. CN V: Facial sensation is intact to pinprick in all 3 divisions bilaterally. Corneal responses are intact.  CN VII: Face is symmetric with normal eye closure and smile. CN VIII: Hearing is normal to rubbing fingers CN IX, X: Palate elevates symmetrically. Phonation is normal. CN XI: Head turning and shoulder shrug are intact CN XII: Tongue is midline with normal movements and no atrophy.  MOTOR: There is no pronator drift of out-stretched arms. Muscle bulk and tone are normal. Muscle strength is normal.  REFLEXES: Reflexes are 2+ and symmetric at the biceps, triceps, knees, and trace at ankles. Plantar responses are flexor.  SENSORY: Length dependent decreased pinprick to midshin, and absent vibration sense to bilateral knee level, proprioception are intact in fingers and toes.  COORDINATION: Rapid alternating movements and fine finger movements are intact. There is no dysmetria on finger-to-nose and heel-knee-shin. There are no abnormal or extraneous movements.   GAIT/STANCE: Mild antalgic gait, mild difficulty perform tiptoe, heel, tandem walking, dragging her right leg Romberg is absent.   DIAGNOSTIC DATA (LABS,  IMAGING, TESTING) - I reviewed patient records, labs, notes, testing and imaging myself where available.  ASSESSMENT AND PLAN  Ebony Mason is a 75 y.o. female with history of hypertension, hyperlipidemia, atrial fibrillation, on Eliquis treatment, presenting with bilateral feet paresthesia since 2012, slow progression, mild length dependent sensory changes, preserved ankle reflexes. Also complains of low back pain  Bilateral lower extremity paresthesia  EMG nerve conduction study confirmed mild length dependent axonal peripheral neuropathy  No significant abnormality on MRI of lumbar  Gabapentin 600 mg 3-4 tablets a day, add on Trileptal 150 mg twice a day   Gait abnormality  Multifactorial, aging process, right hip pain  Proceed with MRI of cervical spine    Marcial Pacas, M.D. Ph.D.  [facility] [address] [place], [place] [address] Ph: [phone] Fax: [phone]

## 2015-03-27 ENCOUNTER — Telehealth: Payer: Self-pay | Admitting: Neurology

## 2015-03-27 ENCOUNTER — Ambulatory Visit
Admission: RE | Admit: 2015-03-27 | Discharge: 2015-03-27 | Disposition: A | Discharge status: Home / Self Care | Payer: Medicare Other | Source: Ambulatory Visit | Attending: Neurology | Admitting: Neurology

## 2015-03-27 DIAGNOSIS — R202 Paresthesia of skin: Secondary | ICD-10-CM

## 2015-03-27 DIAGNOSIS — M545 Low back pain, unspecified: Secondary | ICD-10-CM

## 2015-03-27 DIAGNOSIS — R269 Unspecified abnormalities of gait and mobility: Secondary | ICD-10-CM

## 2015-03-27 NOTE — Telephone Encounter (Signed)
Called pt to discuss MRI results, no answer, left a message asking her to call me back.

## 2015-03-27 NOTE — Telephone Encounter (Signed)
Please call patient, MRI of the cervical spine showed evidence of multilevel degenerative disc disease, most severe at C4-5, C5-6, I will review detail at her next follow-up visit   1. At C4-5 there is a left paracentral/foraminal disc protrusion with moderate left foraminal stenosis. 2. At C5-6 there is a moderate broad-based disc bulge effacing the ventral CSF space. Mild bilateral foraminal narrowing. 3. At C6-7 there is a mild broad-based disc bulge.

## 2015-03-27 NOTE — Telephone Encounter (Signed)
Spoke to pt and advised her that her MRI results showed multilevel DDD, most severe at c4-5, and c5-6, and that Dr. Krista Blue will go over in more detail her results at the next office visit. Pt verbalized understanding and had no further questions at this time.

## 2015-04-23 ENCOUNTER — Encounter: Payer: Self-pay | Admitting: Neurology

## 2015-04-23 ENCOUNTER — Ambulatory Visit (INDEPENDENT_AMBULATORY_CARE_PROVIDER_SITE_OTHER): Payer: Medicare Other | Admitting: Neurology

## 2015-04-23 VITALS — BP 136/92 | HR 102 | Ht 68.0 in | Wt 214.0 lb

## 2015-04-23 DIAGNOSIS — G609 Hereditary and idiopathic neuropathy, unspecified: Secondary | ICD-10-CM | POA: Diagnosis not present

## 2015-04-23 DIAGNOSIS — R269 Unspecified abnormalities of gait and mobility: Secondary | ICD-10-CM

## 2015-04-23 MED ORDER — CAPSAICIN 0.1 % EX CREA: TOPICAL_CREAM | CUTANEOUS | Status: DC

## 2015-04-23 MED ORDER — NORTRIPTYLINE HCL 50 MG PO CAPS: ORAL_CAPSULE | ORAL | Status: DC

## 2015-04-23 MED ORDER — L-METHYLFOLATE-B6-B12 3-35-2 MG PO TABS: 1.0000 | ORAL_TABLET | Freq: Two times a day (BID) | ORAL | Status: DC

## 2015-04-23 NOTE — Progress Notes (Signed)
Chief Complaint  Patient presents with  . Abnormality of gait    She is here with her husband, Luciana Axe.  She would like to discuss her MRI results.   Chief Complaint  Patient presents with  . Abnormality of gait    She is here with her husband, Luciana Axe.  She would like to discuss her MRI results.      PATIENT: Ebony Mason DOB: [birth year]  Chief Complaint  Patient presents with  . Abnormality of gait    She is here with her husband, Luciana Axe.  She would like to discuss her MRI results.    HISTORICAL  Ebony Mason is a 75 years old right-handed female, referred by her primary care physician Dr. Donavan Burnet for evaluation of bilateral feet paresthesia  She had a history of hypertension, hyperlipidemia, atrial fibrillation, taking amiodarone, start Eliquis since Feb 25 2015, also had a history of B12 deficiency, taking monthly B12 intramuscular injection since February 2013.  She began to notice bilateral plantar feet paresthesia burning stinging pain since 2012, getting worse after bearing weight, over the years, had slow progression, seems to involving top of her toes some, but below ankle level  She also complains of midline low back pain, no significant shooting pain, her bilateral feet pain, and the low back pain getting worse after walking, even about 100 feet towards her mailbox  Over the years, she has seen different specialists, neurologist, podiatrist,  was given prescription, titrating dose of gabapentin, currently taking 600 mg 1-2 tablets each day, previously she also tried Lyrica, acupuncture, physical therapy, with limited help,  She was evaluated by neurosurgeon December 2015, no surgical intervention is indicated, recent bilateral plantar feet injection Jul 22 2014 by podiatrist failed to provide prolonged relief.  We have reviewed MRI of lumbar spine in 2013, mild multilevel degenerative disc disease, no significant foraminal or canal stenosis,  She denies bilateral hands  paresthesia, gait difficulty due to low back pain, feet pain, no bowel bladder incontinence, no persistent muscle weakness.  UPDATE Mar 17 2015: She denies neck pain, denies bowel and bladder incontinence, she continues to have bilateral feet paresthesia, burning pain, mild gait difficulty. She was recently diagnosed with Reynold's phenomenon.  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   Previously she has tried cymbalta,-no help, Lyrica-no help, Votaren gel-help some.   REVIEW OF SYSTEMS: Full 14 system review of systems performed and notable only for ringing ears, apnea, difficulty walking, daytime sleepiness.  ALLERGIES: No Known Allergies  HOME MEDICATIONS: Current Outpatient Prescriptions  Medication Sig Dispense Refill  . amiodarone (PACERONE) 200 MG tablet Take 200 mg by mouth.    . Cyanocobalamin (VITAMIN B-12 IJ) Inject 1,000 mcg as directed every 30 (thirty) days.    Marland Kitchen ELIQUIS 5 MG TABS tablet TAKE ONE TABLET BY MOUTH TWICE DAILY FOR THINNING BLOOD  5  . gabapentin (NEURONTIN) 600 MG tablet Take 600 mg by mouth. Taking 3-4 times daily.    Marland Kitchen HYDROcodone-acetaminophen (NORCO/VICODIN) 5-325 MG per tablet Take by mouth.    . lovastatin (MEVACOR) 40 MG tablet TAKE ONE TABLET EVERY EVENING FOR CHOLESTEROL  3  . metoprolol succinate (TOPROL-XL) 100 MG 24 hr tablet Take  50 mg by mouth daily.     Marland Kitchen omeprazole (PRILOSEC) 20 MG capsule Take 20 mg by mouth daily.     . OXcarbazepine (TRILEPTAL) 150 MG tablet Take 1 tablet (150 mg total) by mouth 2 (two) times daily. 60 tablet 6  . oxybutynin (DITROPAN-XL) 5 MG 24 hr tablet Take 5 mg by mouth daily.    Marland Kitchen  triamterene-hydrochlorothiazide (MAXZIDE-25) 37.5-25 MG per tablet Take 1 tablet by mouth daily.      No current facility-administered medications for this visit.    PAST MEDICAL HISTORY: Past Medical History  Diagnosis Date  . Paroxysmal atrial fibrillation (HCC)   . Hypertension   . Peripheral neuropathy ([place])   . Cataract bilateral  . OSA (obstructive sleep apnea)     PAST SURGICAL HISTORY: Past Surgical History  Procedure Laterality Date  . Lower extremity venous doppler  11/18/2010    No evidence of thrombus or thrombophlebitis. No venous insufficiency was noted.  . Abdominal hysterectomy      FAMILY HISTORY: Family History  Problem Relation Age of Onset  . Liver cancer Mother   . Diabetes Maternal Grandfather   . Alzheimer's disease Father     SOCIAL HISTORY:  Social History   Social History  . Marital Status: Married    Spouse Name: N/A  . Number of Children: 2  . Years of Education: 13   Occupational History  . Retired    Social History Main Topics  . Smoking status: Never Smoker   . Smokeless tobacco: Not on file  . Alcohol Use: No  . Drug Use: No  . Sexual Activity: Not on file   Other Topics Concern  . Not on file   Social History Narrative   Lives at home with her husband.   Right-handed.   1 cup caffeine per day.    PHYSICAL EXAM   Filed Vitals:   04/23/15 1136  BP: 136/92  Pulse: 102  Height: 5\' 8"  (1.727 m)  Weight: 214 lb (97.07 kg)    Not recorded      Body mass index is 32.55 kg/(m^2).  PHYSICAL EXAMNIATION:  Gen: NAD, conversant, well nourised, obese, well groomed                     Cardiovascular: Regular rate rhythm, no peripheral edema, warm, nontender. Eyes: Conjunctivae clear without exudates or hemorrhage Neck: Supple, no carotid bruise. Pulmonary: Clear to auscultation bilaterally   NEUROLOGICAL EXAM:  MENTAL STATUS: Speech:    Speech is normal; fluent and spontaneous with normal comprehension.    Cognition:    The patient is oriented to person, place, and time;     recent and remote memory intact;     language fluent;     normal attention, concentration,     fund of knowledge.  CRANIAL NERVES: CN II: Visual fields are full to confrontation. Fundoscopic exam is normal with sharp discs and no vascular changes. Pupil are equal round reactive to light CN III, IV, VI: extraocular movement are normal. No ptosis. CN V: Facial sensation is intact to pinprick in all 3 divisions bilaterally. Corneal responses are intact.  CN VII: Face is symmetric with normal eye closure and smile. CN VIII: Hearing is normal to rubbing fingers CN IX, X: Palate elevates symmetrically. Phonation is normal. CN XI: Head turning and shoulder shrug are intact CN XII: Tongue is midline with normal movements and no atrophy.  MOTOR: There is no pronator drift of out-stretched arms.  Muscle bulk and tone are normal. Muscle strength is normal.  REFLEXES: Reflexes are 2+ and symmetric at the biceps, triceps, knees, and trace at ankles. Plantar responses are flexor.  SENSORY: Length dependent decreased pinprick to midshin, and absent vibration sense to bilateral knee level, proprioception are intact in fingers and toes.  COORDINATION: Rapid alternating movements and fine finger movements are intact. There is no dysmetria on finger-to-nose and heel-knee-shin.  GAIT/STANCE: Mild antalgic gait, mild difficulty perform tiptoe, heel, tandem walking, dragging her right leg Romberg is absent.   DIAGNOSTIC DATA (LABS, IMAGING, TESTING) - I reviewed patient records, labs, notes, testing and imaging myself where available.  ASSESSMENT AND PLAN  Ebony Mason is a 75 y.o. female with history of hypertension, hyperlipidemia, atrial fibrillation, on Eliquis treatment, presenting with bilateral feet paresthesia since 2012, slow progression, mild length dependent sensory changes, preserved ankle reflexes. 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 Ph.D.  [facility] [address] [place], [place] [address] Ph: [phone] Fax: [phone]

## 2015-04-27 ENCOUNTER — Telehealth: Payer: Self-pay | Admitting: Neurology

## 2015-04-27 LAB — COMPREHENSIVE METABOLIC PANEL
A/G RATIO: 1.8 (ref 1.1–2.5)
ALT: 18 IU/L (ref 0–32)
AST: 23 IU/L (ref 0–40)
Albumin: 4.2 g/dL (ref 3.5–4.8)
Alkaline Phosphatase: 112 IU/L (ref 39–117)
BILIRUBIN TOTAL: 0.5 mg/dL (ref 0.0–1.2)
BUN/Creatinine Ratio: 13 (ref 11–26)
BUN: 18 mg/dL (ref 8–27)
CALCIUM: 9.5 mg/dL (ref 8.7–10.3)
CHLORIDE: 101 mmol/L (ref 96–106)
CO2: 25 mmol/L (ref 18–29)
Creatinine, Ser: 1.4 mg/dL — ABNORMAL HIGH (ref 0.57–1.00)
GFR, EST AFRICAN AMERICAN: 42 mL/min/{1.73_m2} — AB (ref >59)
GFR, EST NON AFRICAN AMERICAN: 37 mL/min/{1.73_m2} — AB (ref >59)
GLOBULIN, TOTAL: 2.4 g/dL (ref 1.5–4.5)
Glucose: 100 mg/dL — ABNORMAL HIGH (ref 65–99)
POTASSIUM: 4.5 mmol/L (ref 3.5–5.2)
SODIUM: 145 mmol/L — AB (ref 134–144)
TOTAL PROTEIN: 6.6 g/dL (ref 6.0–8.5)

## 2015-04-27 LAB — CBC
Hematocrit: 38.4 % (ref 34.0–46.6)
Hemoglobin: 12.4 g/dL (ref 11.1–15.9)
MCH: 30.3 pg (ref 26.6–33.0)
MCHC: 32.3 g/dL (ref 31.5–35.7)
MCV: 94 fL (ref 79–97)
PLATELETS: 314 10*3/uL (ref 150–379)
RBC: 4.09 x10E6/uL (ref 3.77–5.28)
RDW: 14.8 % (ref 12.3–15.4)
WBC: 8.3 10*3/uL (ref 3.4–10.8)

## 2015-04-27 LAB — THYROID PANEL WITH TSH
Free Thyroxine Index: 2.4 (ref 1.2–4.9)
T3 Uptake Ratio: 28 % (ref 24–39)
T4, Total: 8.6 ug/dL (ref 4.5–12.0)
TSH: 4.2 u[IU]/mL (ref 0.450–4.500)

## 2015-04-27 LAB — SEDIMENTATION RATE: SED RATE: 23 mm/h (ref 0–40)

## 2015-04-27 LAB — RPR: RPR Ser Ql: NONREACTIVE

## 2015-04-27 LAB — PROTEIN ELECTROPHORESIS
A/G RATIO SPE: 1 (ref 0.7–1.7)
ALBUMIN ELP: 3.3 g/dL (ref 2.9–4.4)
ALPHA 2: 0.9 g/dL (ref 0.4–1.0)
Alpha 1: 0.3 g/dL (ref 0.0–0.4)
BETA: 1.3 g/dL (ref 0.7–1.3)
GAMMA GLOBULIN: 0.8 g/dL (ref 0.4–1.8)
GLOBULIN, TOTAL: 3.3 g/dL (ref 2.2–3.9)

## 2015-04-27 LAB — IRON AND TIBC
Iron Saturation: 18 % (ref 15–55)
Iron: 58 ug/dL (ref 27–139)
TIBC: 329 ug/dL (ref 250–450)
UIBC: 271 ug/dL (ref 118–369)

## 2015-04-27 LAB — VITAMIN B1: THIAMINE: 172.8 nmol/L (ref 66.5–200.0)

## 2015-04-27 LAB — FERRITIN: Ferritin: 70 ng/mL (ref 15–150)

## 2015-04-27 LAB — CK: Total CK: 94 U/L (ref 24–173)

## 2015-04-27 LAB — HGB A1C W/O EAG: HEMOGLOBIN A1C: 5.9 % — AB (ref 4.8–5.6)

## 2015-04-27 NOTE — Telephone Encounter (Signed)
Patient aware of results.  Faxed to PCP, Dr. Donavan Burnet, at [phone].

## 2015-04-27 NOTE — Telephone Encounter (Signed)
Please call patient laboratory evaluation showed mild abnormal kidney function, Creatinine 1.4, A1c was mildly elevated 5.9, otherwise normal CBC, TSH,

## 2015-07-27 ENCOUNTER — Ambulatory Visit (INDEPENDENT_AMBULATORY_CARE_PROVIDER_SITE_OTHER): Payer: Medicare Other | Admitting: Neurology

## 2015-07-27 ENCOUNTER — Encounter: Payer: Self-pay | Admitting: Neurology

## 2015-07-27 VITALS — BP 142/80 | HR 78 | Ht 68.0 in | Wt 211.8 lb

## 2015-07-27 DIAGNOSIS — G6289 Other specified polyneuropathies: Secondary | ICD-10-CM

## 2015-07-27 DIAGNOSIS — R202 Paresthesia of skin: Secondary | ICD-10-CM

## 2015-07-27 DIAGNOSIS — R7303 Prediabetes: Secondary | ICD-10-CM | POA: Insufficient documentation

## 2015-07-27 DIAGNOSIS — R269 Unspecified abnormalities of gait and mobility: Secondary | ICD-10-CM | POA: Diagnosis not present

## 2015-07-27 DIAGNOSIS — I480 Paroxysmal atrial fibrillation: Secondary | ICD-10-CM | POA: Diagnosis not present

## 2015-07-27 NOTE — Progress Notes (Signed)
Chief Complaint  Patient presents with  . Peripheral Neuropathy    She is here with her husband, Ebony Mason. Reports her symptoms are some improved with gabapentin.  She was not able to tolerate nortriptyline due to dizziness.   . Sleep Apnea    She was recently diagnosed with OSA and is nowing using a CPAP machine at night.  . Raynaud's Syndrome    Reports being diagnosed by her PCP.   Chief Complaint  Patient presents with  . Peripheral Neuropathy    She is here with her husband, Ebony Mason. Reports her symptoms are some improved with gabapentin.  She was not able to tolerate nortriptyline due to dizziness.   . Sleep Apnea    She was recently diagnosed with OSA and is nowing using a CPAP machine at night.  . Raynaud's Syndrome    Reports being diagnosed by her PCP.   Chief Complaint  Patient presents with  . Peripheral Neuropathy    She is here with her husband, Ebony Mason. Reports her symptoms are some improved with gabapentin.  She was not able to tolerate nortriptyline due to dizziness.   . Sleep Apnea    She was recently diagnosed with OSA and is nowing using a CPAP machine at night.  . Raynaud's Syndrome    Reports being diagnosed by her PCP.      PATIENT: Ebony Mason DOB: 07-May-1940  Chief Complaint  Patient presents with  . Peripheral Neuropathy    She is here with her husband, Ebony Mason. Reports her symptoms are some improved with gabapentin.  She was not able to tolerate nortriptyline due to dizziness.   . Sleep Apnea    She was recently diagnosed with OSA and is nowing using a CPAP machine at night.  . Raynaud's Syndrome    Reports being diagnosed by her PCP.    HISTORICAL  Ebony Mason is a 75 years old right-handed female, referred by her primary care physician Dr. Donavan Mason for evaluation of bilateral feet paresthesia  She had a history of hypertension, hyperlipidemia, atrial fibrillation, taking amiodarone, start Eliquis since Feb 25 2015, also had a history of B12  deficiency, taking monthly B12 intramuscular injection since February 2013.  She began to notice bilateral plantar feet paresthesia burning stinging pain since 2012, getting worse after bearing weight, over the years, had slow progression, seems to involving top of her toes some, but below ankle level  She also complains of midline low back pain, no significant shooting pain, her bilateral feet pain, and the low back pain getting worse after walking, even about 100 feet towards her mailbox  Over the years, she has seen different specialists, neurologist, podiatrist,  was given prescription, titrating dose of gabapentin, currently taking 600 mg 1-2 tablets each day, previously she also tried Lyrica, acupuncture, physical therapy, with limited help,  She was evaluated by neurosurgeon December 2015, no surgical intervention is indicated, recent bilateral plantar feet injection Jul 22 2014 by podiatrist failed to provide prolonged relief.  We have reviewed MRI of lumbar spine in 2013, mild multilevel degenerative disc disease, no significant foraminal or canal stenosis,  She denies bilateral hands paresthesia, gait difficulty due to low back pain, feet pain, no bowel bladder incontinence, no persistent muscle weakness.  UPDATE Jan 77 2017: She denies neck pain, denies bowel and bladder incontinence, she continues to have bilateral feet paresthesia, burning pain, mild gait difficulty. She was recently diagnosed with Reynold's phenomenon.  UPDATE Feb 9th 2017: EMG nerve  conduction study in June 2016 confirmed mild axonal peripheral neuropathy She continues to complains of bilateral feet burning, pain, especially at plantar surface, getting worse after bearing weight, she is now taking gabapentin 665m qid, trileptal 1563mbid only made slight difference, she woke up almost every night, feel her feet burning.   Previously she has tried cymbalta,-no help, Lyrica-no help, Votaren gel-help some.   UPDATE  May 15th 2017: She is now taking gabapentin 600 mg 3-4 times a day, tried nortriptyline every night, but complains of dizziness, passing out during the day,  We reviewed laboratory evaluation in February 2017, elevated A1c 5.9, CMP showed mild elevated creatinine 1.14, normal CBC, ESR, C-reactive protein, B1,CPK,  She continues to complain bilateral feet paresthesia, discoloration,  REVIEW OF SYSTEMS: Full 14 system review of systems performed and notable only for ringing ears, apnea, shortness of breath, leg swelling, back pain, walking difficulty dizziness, numbness, weakness  ALLERGIES: No Known Allergies  HOME MEDICATIONS: Current Outpatient Prescriptions  Medication Sig Dispense Refill  . amiodarone (PACERONE) 200 MG tablet Take 200 mg by mouth.    . Cyanocobalamin (VITAMIN B-12 IJ) Inject 1,000 mcg as directed every 30 (thirty) days.    . Marland Kitchenabapentin (NEURONTIN) 600 MG tablet Take 600 mg by mouth. Taking 3-4 times daily.    . Marland KitchenYDROcodone-acetaminophen (NORCO/VICODIN) 5-325 MG per tablet Take by mouth.    . Marland Kitchen-methylfolate-B6-B12 (METANX) 3-35-2 MG TABS tablet Take 1 tablet by mouth 2 (two) times daily. 60 tablet 11  . lovastatin (MEVACOR) 40 MG tablet TAKE ONE TABLET EVERY EVENING FOR CHOLESTEROL  3  . metoprolol succinate (TOPROL-XL) 100 MG 24 hr tablet Take 50 mg by mouth daily.     . Marland Kitchenmeprazole (PRILOSEC) 20 MG capsule Take 20 mg by mouth daily.     . OXcarbazepine (TRILEPTAL) 150 MG tablet Take 1 tablet (150 mg total) by mouth 2 (two) times daily. 60 tablet 6  . oxybutynin (DITROPAN-XL) 5 MG 24 hr tablet Take 5 mg by mouth daily.    . Marland Kitchenriamterene-hydrochlorothiazide (MAXZIDE-25) 37.5-25 MG per tablet Take 1 tablet by mouth daily.     . Marland Kitchenarfarin (COUMADIN) 3 MG tablet Take 3 mg by mouth daily.  12   No current facility-administered medications for this visit.    PAST MEDICAL HISTORY: Past Medical History  Diagnosis Date  . Paroxysmal atrial fibrillation (HCC)   .  Hypertension   . [place]  . Cataract bilateral  . OSA (obstructive sleep apnea)   . Raynaud's syndrome     PAST SURGICAL HISTORY: Past Surgical History  Procedure Laterality Date  . Lower extremity venous doppler  11/18/2010    No evidence of thrombus or thrombophlebitis. No venous insufficiency was noted.  . Abdominal hysterectomy      FAMILY HISTORY: Family History  Problem Relation Age of Onset  . Liver cancer Mother   . Diabetes Maternal Grandfather   . Alzheimer's disease Father     SOCIAL HISTORY:  Social History   Social History  . Marital Status: Married    Spouse Name: N/A  . Number of Children: 2  . Years of Education: 13   Occupational History  . Retired    Social History Main Topics  . Smoking status: Never Smoker   . Smokeless tobacco: Not on file  . Alcohol Use: No  . Drug Use: No  . Sexual Activity: Not on file   Other Topics Concern  . Not on file   Social History Narrative  Lives at home with her husband.   Right-handed.   1 cup caffeine per day.    PHYSICAL EXAM   Filed Vitals:   07/27/15 1131  BP: 142/80  Pulse: 78  Height: _0  (1.727 m)  Weight: 211 lb 12 oz (96.049 kg)    Not recorded      Body mass index is 32.2 kg/(m^2).  PHYSICAL EXAMNIATION:  Gen: NAD, conversant, well nourised, obese, well groomed                     Cardiovascular: Regular rate rhythm, no peripheral edema, warm, nontender. Eyes: Conjunctivae clear without exudates or hemorrhage Neck: Supple, no carotid bruise. Pulmonary: Clear to auscultation bilaterally   NEUROLOGICAL EXAM:  MENTAL STATUS: Speech:    Speech is normal; fluent and spontaneous with normal comprehension.  Cognition:    The patient is oriented to person, place, and time;     recent and remote memory intact;     language fluent;     normal attention, concentration,     fund of knowledge.  CRANIAL NERVES: CN II: Visual fields are full to confrontation.  Fundoscopic exam is normal with sharp discs and no vascular changes. Pupil are equal round reactive to light CN III, IV, VI: extraocular movement are normal. No ptosis. CN V: Facial sensation is intact to pinprick in all 3 divisions bilaterally. Corneal responses are intact.  CN VII: Face is symmetric with normal eye closure and smile. CN VIII: Hearing is normal to rubbing fingers CN IX, X: Palate elevates symmetrically. Phonation is normal. CN XI: Head turning and shoulder shrug are intact CN XII: Tongue is midline with normal movements and no atrophy.  MOTOR: There is no pronator drift of out-stretched arms. Muscle bulk and tone are normal. Muscle strength is normal.  REFLEXES: Reflexes are 2+ and symmetric at the biceps, triceps, knees, and trace at ankles. Plantar responses are flexor.  SENSORY: Length dependent decreased pinprick to midshin, and absent vibration sense to bilateral knee level, proprioception are intact in fingers and toes.  COORDINATION: Rapid alternating movements and fine finger movements are intact. There is no dysmetria on finger-to-nose and heel-knee-shin.  GAIT/STANCE: Mild antalgic gait, mild difficulty perform tiptoe, heel, tandem walking, dragging her right leg Romberg is absent.   DIAGNOSTIC DATA (LABS, IMAGING, TESTING) - I reviewed patient records, labs, notes, testing and imaging myself where available.  ASSESSMENT AND PLAN  TINISHA ETZKORN is a 75 y.o. female with history of hypertension, hyperlipidemia, atrial fibrillation, on Eliquis treatment, presenting with bilateral feet paresthesia since 2012, slow progression, mild length dependent sensory changes, preserved ankle reflexes. 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   Laboratory evaluation showed elevated A1c 5.9, consistent with prediabetes, likely account for her mild axonal peripheral neuropathy  I have encouraged her continue moderate exercise  Repeat laboratory evaluation today       Ebony Mason, M.D. Ph.D.  [facility] [address] [place], [place] [address] Ph: [phone] Fax: [phone]

## 2015-07-28 ENCOUNTER — Telehealth: Payer: Self-pay | Admitting: Neurology

## 2015-07-28 LAB — HGB A1C W/O EAG: Hgb A1c MFr Bld: 6 % — ABNORMAL HIGH (ref 4.8–5.6)

## 2015-07-28 LAB — VITAMIN B12: Vitamin B-12: 624 pg/mL (ref 211–946)

## 2015-07-28 LAB — ANA W/REFLEX IF POSITIVE: Anti Nuclear Antibody(ANA): NEGATIVE

## 2015-07-28 NOTE — Telephone Encounter (Signed)
Please call patient, repeats laboratory evaluation showed mildly increased A1c 6.0 which is higher than previous 5.9,   she should continue exercise, diet controlled, I also forward laboratory evaluation to her primary care physician Dr. Donavan Burnet

## 2015-07-28 NOTE — Telephone Encounter (Signed)
Spoke to patient she is aware of results

## 2015-12-16 NOTE — Progress Notes (Signed)
Pt is being scheduled for preop appt; please place surgical orders in epic. Thanks.  

## 2015-12-17 ENCOUNTER — Ambulatory Visit: Payer: Self-pay | Admitting: Orthopedic Surgery

## 2015-12-18 ENCOUNTER — Encounter (HOSPITAL_COMMUNITY): Payer: Self-pay

## 2015-12-23 NOTE — Patient Instructions (Addendum)
Ebony Mason  12/23/2015   Your procedure is scheduled on: 12-31-15  Report to [facility] Main  Entrance take [facility]  elevators to 3rd floor to  [place] at   1:45 PM.  Call this number if you have problems the morning of surgery [phone]  For Cpap use-bring mask/tubing.  Remember: ONLY 1 PERSON MAY GO WITH YOU TO SHORT STAY TO GET  READY MORNING OF YOUR SURGERY.  Do not eat food or drink liquids :After Midnight.Exception- may have Clear liquids 12 midnight to 1000, then nothing.   CLEAR LIQUID DIET   Foods Allowed                                                                     Foods Excluded  Coffee and tea, regular and decaf                             liquids that you cannot  Plain Jell-O in any flavor                                             see through such as: Fruit ices (not with fruit pulp)                                     milk, soups, orange juice  Iced Popsicles                                    All solid food Carbonated beverages, regular and diet                                    Cranberry, grape and apple juices Sports drinks like Gatorade Lightly seasoned clear broth or consume(fat free) Sugar, honey syrup   _____________________________________________________________________       Take these medicines the morning of surgery with A SIP OF WATER: Gabapentin. Prilosec. Oxybutynin. Use Coumadin per MD instructions.  DO NOT TAKE ANY DIABETIC MEDICATIONS DAY OF YOUR SURGERY                               You may not have any metal on your body including hair pins and              piercings  Do not wear jewelry, make-up, lotions, powders or perfumes, deodorant             Do not wear nail polish.  Do not shave  48 hours prior to surgery.              Men may shave face and neck.   Do not bring valuables to the hospital. [facility] IS NOT  RESPONSIBLE   FOR VALUABLES.  Contacts, dentures or bridgework  may not be worn into surgery.  Leave suitcase in the car. After surgery it may be brought to your room.     Patients discharged the day of surgery will not be allowed to drive home.  Name and phone number of your driver:Boyd-spouse [phone] cell  Special Instructions: N/A              Please read over the following fact sheets you were given: _____________________________________________________________________             [facility] - Preparing for Surgery Before surgery, you can play an important role.  Because skin is not sterile, your skin needs to be as free of germs as possible.  You can reduce the number of germs on your skin by washing with CHG (chlorahexidine gluconate) soap before surgery.  CHG is an antiseptic cleaner which kills germs and bonds with the skin to continue killing germs even after washing. Please DO NOT use if you have an allergy to CHG or antibacterial soaps.  If your skin becomes reddened/irritated stop using the CHG and inform your nurse when you arrive at Short Stay. Do not shave (including legs and underarms) for at least 48 hours prior to the first CHG shower.  You may shave your face/neck. Please follow these instructions carefully:  1.  Shower with CHG Soap the night before surgery and the  morning of Surgery.  2.  If you choose to wash your hair, wash your hair first as usual with your  normal  shampoo.  3.  After you shampoo, rinse your hair and body thoroughly to remove the  shampoo.                           4.  Use CHG as you would any other liquid soap.  You can apply chg directly  to the skin and wash                       Gently with a scrungie or clean washcloth.  5.  Apply the CHG Soap to your body ONLY FROM THE NECK DOWN.   Do not use on face/ open                           Wound or open sores. Avoid contact with eyes, ears mouth and genitals (private parts).                       Wash face,  Genitals (private parts) with your normal soap.              6.  Wash thoroughly, paying special attention to the area where your surgery  will be performed.  7.  Thoroughly rinse your body with warm water from the neck down.  8.  DO NOT shower/wash with your normal soap after using and rinsing off  the CHG Soap.                9.  Pat yourself dry with a clean towel.            10.  Wear clean pajamas.            11.  Place clean sheets on your bed the night of your first shower and do not  sleep with pets. Day of  Surgery : Do not apply any lotions/deodorants the morning of surgery.  Please wear clean clothes to the hospital/surgery center.  FAILURE TO FOLLOW THESE INSTRUCTIONS MAY RESULT IN THE CANCELLATION OF YOUR SURGERY PATIENT SIGNATURE_________________________________  NURSE SIGNATURE__________________________________  ________________________________________________________________________   [name]  An incentive spirometer is a tool that can help keep your lungs clear and active. This tool measures how well you are filling your lungs with each breath. Taking long deep breaths may help reverse or decrease the chance of developing breathing (pulmonary) problems (especially infection) following:  A long period of time when you are unable to move or be active. BEFORE THE PROCEDURE   If the spirometer includes an indicator to show your best effort, your nurse or respiratory therapist will set it to a desired goal.  If possible, sit up straight or lean slightly forward. Try not to slouch.  Hold the incentive spirometer in an upright position. INSTRUCTIONS FOR USE  1. Sit on the edge of your bed if possible, or sit up as far as you can in bed or on a chair. 2. Hold the incentive spirometer in an upright position. 3. Breathe out normally. 4. Place the mouthpiece in your mouth and seal your lips tightly around it. 5. Breathe in slowly and as deeply as possible, raising the piston or the ball toward the top of the  column. 6. Hold your breath for 3-5 seconds or for as long as possible. Allow the piston or ball to fall to the bottom of the column. 7. Remove the mouthpiece from your mouth and breathe out normally. 8. Rest for a few seconds and repeat Steps 1 through 7 at least 10 times every 1-2 hours when you are awake. Take your time and take a few normal breaths between deep breaths. 9. The spirometer may include an indicator to show your best effort. Use the indicator as a goal to work toward during each repetition. 10. After each set of 10 deep breaths, practice coughing to be sure your lungs are clear. If you have an incision (the cut made at the time of surgery), support your incision when coughing by placing a pillow or rolled up towels firmly against it. Once you are able to get out of bed, walk around indoors and cough well. You may stop using the incentive spirometer when instructed by your caregiver.  RISKS AND COMPLICATIONS  Take your time so you do not get dizzy or light-headed.  If you are in pain, you may need to take or ask for pain medication before doing incentive spirometry. It is harder to take a deep breath if you are having pain. AFTER USE  Rest and breathe slowly and easily.  It can be helpful to keep track of a log of your progress. Your caregiver can provide you with a simple table to help with this. If you are using the spirometer at home, follow these instructions: [place] IF:   You are having difficultly using the spirometer.  You have trouble using the spirometer as often as instructed.  Your pain medication is not giving enough relief while using the spirometer.  You develop fever of 100.5 F (38.1 C) or higher. SEEK IMMEDIATE MEDICAL CARE IF:   You cough up bloody sputum that had not been present before.  You develop fever of 102 F (38.9 C) or greater.  You develop worsening pain at or near the incision site. MAKE SURE YOU:   Understand these  instructions.  Will  watch your condition.  Will get help right away if you are not doing well or get worse. Document Released: 07/11/2006 Document Revised: 05/23/2011 Document Reviewed: 09/11/2006 ExitCare Patient Information 2014 Exit[employer], [place].   ________________________________________________________________________  WHAT IS A BLOOD TRANSFUSION? Blood Transfusion Information  A transfusion is the replacement of blood or some of its parts. Blood is made up of multiple cells which provide different functions.  Red blood cells carry oxygen and are used for blood loss replacement.  White blood cells fight against infection.  Platelets control bleeding.  Plasma helps clot blood.  Other blood products are available for specialized needs, such as hemophilia or other clotting disorders. BEFORE THE TRANSFUSION  Who gives blood for transfusions?   Healthy volunteers who are fully evaluated to make sure their blood is safe. This is blood bank blood. Transfusion therapy is the safest it has ever been in the practice of medicine. Before blood is taken from a donor, a complete history is taken to make sure that person has no history of diseases nor engages in risky social behavior (examples are intravenous drug use or sexual activity with multiple partners). The donor's travel history is screened to minimize risk of transmitting infections, such as malaria. The donated blood is tested for signs of infectious diseases, such as HIV and hepatitis. The blood is then tested to be sure it is compatible with you in order to minimize the chance of a transfusion reaction. If you or a relative donates blood, this is often done in anticipation of surgery and is not appropriate for emergency situations. It takes many days to process the donated blood. RISKS AND COMPLICATIONS Although transfusion therapy is very safe and saves many lives, the main dangers of transfusion include:   Getting an infectious  disease.  Developing a transfusion reaction. This is an allergic reaction to something in the blood you were given. Every precaution is taken to prevent this. The decision to have a blood transfusion has been considered carefully by your caregiver before blood is given. Blood is not given unless the benefits outweigh the risks. AFTER THE TRANSFUSION  Right after receiving a blood transfusion, you will usually feel much better and more energetic. This is especially true if your red blood cells have gotten low (anemic). The transfusion raises the level of the red blood cells which carry oxygen, and this usually causes an energy increase.  The nurse administering the transfusion will monitor you carefully for complications. HOME CARE INSTRUCTIONS  No special instructions are needed after a transfusion. You may find your energy is better. Speak with your caregiver about any limitations on activity for underlying diseases you may have. SEEK MEDICAL CARE IF:   Your condition is not improving after your transfusion.  You develop redness or irritation at the intravenous (IV) site. SEEK IMMEDIATE MEDICAL CARE IF:  Any of the following symptoms occur over the next 12 hours:  Shaking chills.  You have a temperature by mouth above 102 F (38.9 C), not controlled by medicine.  Chest, back, or muscle pain.  People around you feel you are not acting correctly or are confused.  Shortness of breath or difficulty breathing.  Dizziness and fainting.  You get a rash or develop hives.  You have a decrease in urine output.  Your urine turns a dark color or changes to pink, red, or brown. Any of the following symptoms occur over the next 10 days:  You have a temperature by mouth above 102 F (38.9  C), not controlled by medicine.  Shortness of breath.  Weakness after normal activity.  The white part of the eye turns yellow (jaundice).  You have a decrease in the amount of urine or are  urinating less often.  Your urine turns a dark color or changes to pink, red, or brown. Document Released: 02/26/2000 Document Revised: 05/23/2011 Document Reviewed: 10/15/2007 [facility] Patient Information 2014 [place], [place].  _______________________________________________________________________

## 2015-12-24 ENCOUNTER — Encounter (HOSPITAL_COMMUNITY): Payer: Self-pay

## 2015-12-24 ENCOUNTER — Ambulatory Visit: Payer: Self-pay | Admitting: Orthopedic Surgery

## 2015-12-24 ENCOUNTER — Encounter (HOSPITAL_COMMUNITY)
Admission: RE | Admit: 2015-12-24 | Discharge: 2015-12-24 | Disposition: A | Discharge status: Home / Self Care | Payer: Medicare Other | Source: Ambulatory Visit | Attending: Orthopedic Surgery | Admitting: Orthopedic Surgery

## 2015-12-24 DIAGNOSIS — M1611 Unilateral primary osteoarthritis, right hip: Secondary | ICD-10-CM | POA: Diagnosis not present

## 2015-12-24 DIAGNOSIS — Z01812 Encounter for preprocedural laboratory examination: Secondary | ICD-10-CM | POA: Insufficient documentation

## 2015-12-24 HISTORY — DX: Gastro-esophageal reflux disease without esophagitis: K21.9

## 2015-12-24 HISTORY — DX: Unspecified osteoarthritis, unspecified site: M19.90

## 2015-12-24 LAB — CBC
HCT: 35.2 % — ABNORMAL LOW (ref 36.0–46.0)
Hemoglobin: 11.4 g/dL — ABNORMAL LOW (ref 12.0–15.0)
MCH: 29 pg (ref 26.0–34.0)
MCHC: 32.4 g/dL (ref 30.0–36.0)
MCV: 89.6 fL (ref 78.0–100.0)
PLATELETS: 271 10*3/uL (ref 150–400)
RBC: 3.93 MIL/uL (ref 3.87–5.11)
RDW: 15.2 % (ref 11.5–15.5)
WBC: 9.3 10*3/uL (ref 4.0–10.5)

## 2015-12-24 LAB — BASIC METABOLIC PANEL
Anion gap: 7 (ref 5–15)
BUN: 25 mg/dL — ABNORMAL HIGH (ref 6–20)
CALCIUM: 8.9 mg/dL (ref 8.9–10.3)
CHLORIDE: 108 mmol/L (ref 101–111)
CO2: 26 mmol/L (ref 22–32)
CREATININE: 1.54 mg/dL — AB (ref 0.44–1.00)
GFR calc non Af Amer: 31 mL/min — ABNORMAL LOW (ref >60)
GFR, EST AFRICAN AMERICAN: 36 mL/min — AB (ref >60)
Glucose, Bld: 106 mg/dL — ABNORMAL HIGH (ref 65–99)
Potassium: 3.9 mmol/L (ref 3.5–5.1)
SODIUM: 141 mmol/L (ref 135–145)

## 2015-12-24 LAB — ABO/RH: ABO/RH(D): O POS

## 2015-12-24 LAB — SURGICAL PCR SCREEN
MRSA, PCR: NEGATIVE
Staphylococcus aureus: NEGATIVE

## 2015-12-24 NOTE — H&P (Signed)
TOTAL HIP ADMISSION H&P  Patient is admitted for right total hip arthroplasty.  Subjective:  Chief Complaint: right hip pain  HPI: Ebony Mason, 75 y.o. female, has a history of pain and functional disability in the right hip(s) due to arthritis and patient has failed non-surgical conservative treatments for greater than 12 weeks to include NSAID's and/or analgesics, flexibility and strengthening excercises, use of assistive devices and activity modification.  Onset of symptoms was gradual starting 3 years ago with gradually worsening course since that time.The patient noted no past surgery on the right hip(s).  Patient currently rates pain in the right hip at 10 out of 10 with activity. Patient has night pain, worsening of pain with activity and weight bearing, pain that interfers with activities of daily living and pain with passive range of motion. Patient has evidence of subchondral sclerosis, periarticular osteophytes and joint space narrowing by imaging studies. This condition presents safety issues increasing the risk of falls. There is no current active infection.  Patient Active Problem List   Diagnosis Date Noted  . Prediabetes 07/27/2015  . Hereditary and idiopathic peripheral neuropathy 04/23/2015  . Abnormality of gait 03/17/2015  . Paresthesia 09/08/2014  . Midline low back pain without sciatica 09/08/2014  . Peripheral neuropathy ([place]) 02/15/2013  . Chest pain 10/15/2012  . Long term (current) use of anticoagulants 10/02/2012  . Paroxysmal atrial fibrillation ([place]) 09/27/2012  . Essential hypertension 09/27/2012  . Hypertension    Past Medical History:  Diagnosis Date  . Cataract bilateral  . Hypertension   . OSA (obstructive sleep apnea)   . Paroxysmal atrial fibrillation (HCC)   . Peripheral neuropathy ([place])   . Raynaud's syndrome     Past Surgical History:  Procedure Laterality Date  . ABDOMINAL HYSTERECTOMY    . LOWER EXTREMITY VENOUS DOPPLER  11/18/2010   No  evidence of thrombus or thrombophlebitis. No venous insufficiency was noted.     (Not in a hospital admission) No Known Allergies  Social History  Substance Use Topics  . Smoking status: Never Smoker  . Smokeless tobacco: Not on file  . Alcohol use No    Family History  Problem Relation Age of Onset  . Liver cancer Mother   . Alzheimer's disease Father   . Diabetes Maternal Grandfather      Review of Systems  Constitutional: Negative.   HENT: Positive for tinnitus.   Eyes: Negative.   Respiratory: Positive for shortness of breath.   Cardiovascular: Negative.   Gastrointestinal: Positive for constipation.  Genitourinary: Negative.   Musculoskeletal: Positive for back pain and joint pain.  Skin: Negative.   Neurological: Negative.   Endo/Heme/Allergies: Negative.   Psychiatric/Behavioral: Negative.     Objective:  Physical Exam  Vitals reviewed. Constitutional: She is oriented to person, place, and time. She appears well-developed and well-nourished.  HENT:  Head: Normocephalic and atraumatic.  Eyes: Conjunctivae and EOM are normal. Pupils are equal, round, and reactive to light.  Neck: Normal range of motion. Neck supple.  Cardiovascular: Normal rate, regular rhythm and intact distal pulses.   Respiratory: Effort normal. No respiratory distress.  GI: Soft. She exhibits no distension.  Genitourinary:  Genitourinary Comments: deferred  Neurological: She is alert and oriented to person, place, and time. She has normal reflexes.  Skin: Skin is warm and dry.  Psychiatric: She has a normal mood and affect. Her behavior is normal. Judgment and thought content normal.    Vital signs in last 24 hours: @VSRANGES @  Labs:  Estimated body mass index is 32.2 kg/m as calculated from the following:   Height as of 07/27/15: 5\' 8"  (1.727 m).   Weight as of 07/27/15: 96 kg (211 lb 12 oz).   Imaging Review Plain radiographs demonstrate severe degenerative joint disease of  the right hip(s). The bone quality appears to be adequate for age and reported activity level.  Assessment/Plan:  End stage arthritis, right hip(s)  The patient history, physical examination, clinical judgement of the provider and imaging studies are consistent with end stage degenerative joint disease of the right hip(s) and total hip arthroplasty is deemed medically necessary. The treatment options including medical management, injection therapy, arthroscopy and arthroplasty were discussed at length. The risks and benefits of total hip arthroplasty were presented and reviewed. The risks due to aseptic loosening, infection, stiffness, dislocation/subluxation,  thromboembolic complications and other imponderables were discussed.  The patient acknowledged the explanation, agreed to proceed with the plan and consent was signed. Patient is being admitted for inpatient treatment for surgery, pain control, PT, OT, prophylactic antibiotics, VTE prophylaxis, progressive ambulation and ADL's and discharge planning.The patient is planning to be discharged home with home health services. D/C coumadin 5 days preop.

## 2015-12-24 NOTE — Progress Notes (Signed)
12-24-15 1600 labs viewable in Epic-note [facility].

## 2015-12-24 NOTE — Pre-Procedure Instructions (Signed)
EKG 04-20-15. Clearance note- Dr. Amada Kingfisher, Dr. Helene Kelp- with chart.

## 2015-12-31 ENCOUNTER — Inpatient Hospital Stay (HOSPITAL_COMMUNITY): Payer: Medicare Other | Admitting: Certified Registered"

## 2015-12-31 ENCOUNTER — Encounter (HOSPITAL_COMMUNITY): Payer: Self-pay | Admitting: Certified Registered"

## 2015-12-31 ENCOUNTER — Inpatient Hospital Stay (HOSPITAL_COMMUNITY)
Admission: RE | Admit: 2015-12-31 | Discharge: 2016-01-02 | DRG: 470 | Disposition: A | Discharge status: Home Health | Payer: Medicare Other | Source: Ambulatory Visit | Attending: Orthopedic Surgery | Admitting: Orthopedic Surgery

## 2015-12-31 ENCOUNTER — Inpatient Hospital Stay (HOSPITAL_COMMUNITY): Payer: Medicare Other

## 2015-12-31 ENCOUNTER — Encounter (HOSPITAL_COMMUNITY)
Admission: RE | Disposition: A | Discharge status: Home Health | Payer: Self-pay | Source: Ambulatory Visit | Attending: Orthopedic Surgery

## 2015-12-31 DIAGNOSIS — R7303 Prediabetes: Secondary | ICD-10-CM | POA: Diagnosis present

## 2015-12-31 DIAGNOSIS — Z09 Encounter for follow-up examination after completed treatment for conditions other than malignant neoplasm: Secondary | ICD-10-CM

## 2015-12-31 DIAGNOSIS — Z419 Encounter for procedure for purposes other than remedying health state, unspecified: Secondary | ICD-10-CM

## 2015-12-31 DIAGNOSIS — Z833 Family history of diabetes mellitus: Secondary | ICD-10-CM | POA: Diagnosis not present

## 2015-12-31 DIAGNOSIS — K5900 Constipation, unspecified: Secondary | ICD-10-CM | POA: Diagnosis present

## 2015-12-31 DIAGNOSIS — M1611 Unilateral primary osteoarthritis, right hip: Secondary | ICD-10-CM | POA: Diagnosis present

## 2015-12-31 DIAGNOSIS — I480 Paroxysmal atrial fibrillation: Secondary | ICD-10-CM | POA: Diagnosis present

## 2015-12-31 DIAGNOSIS — G629 Polyneuropathy, unspecified: Secondary | ICD-10-CM | POA: Diagnosis present

## 2015-12-31 DIAGNOSIS — I7300 Raynaud's syndrome without gangrene: Secondary | ICD-10-CM | POA: Diagnosis present

## 2015-12-31 DIAGNOSIS — K219 Gastro-esophageal reflux disease without esophagitis: Secondary | ICD-10-CM | POA: Diagnosis present

## 2015-12-31 DIAGNOSIS — Z90710 Acquired absence of both cervix and uterus: Secondary | ICD-10-CM | POA: Diagnosis not present

## 2015-12-31 DIAGNOSIS — Z7901 Long term (current) use of anticoagulants: Secondary | ICD-10-CM | POA: Diagnosis not present

## 2015-12-31 DIAGNOSIS — G4733 Obstructive sleep apnea (adult) (pediatric): Secondary | ICD-10-CM | POA: Diagnosis present

## 2015-12-31 DIAGNOSIS — I10 Essential (primary) hypertension: Secondary | ICD-10-CM | POA: Diagnosis present

## 2015-12-31 HISTORY — PX: TOTAL HIP ARTHROPLASTY: SHX124

## 2015-12-31 LAB — TYPE AND SCREEN
ABO/RH(D): O POS
ANTIBODY SCREEN: NEGATIVE

## 2015-12-31 LAB — PROTIME-INR
INR: 1.19
Prothrombin Time: 15.2 seconds (ref 11.4–15.2)

## 2015-12-31 SURGERY — ARTHROPLASTY, HIP, TOTAL, ANTERIOR APPROACH
Anesthesia: Spinal | Site: Hip | Laterality: Right

## 2015-12-31 MED ORDER — GABAPENTIN 300 MG PO CAPS
600.0000 mg | ORAL_CAPSULE | Freq: Three times a day (TID) | ORAL | Status: DC
  Administered 2016-01-01 – 2016-01-02 (×5): 600 mg via ORAL
  Filled 2015-12-31 (×5): qty 2

## 2015-12-31 MED ORDER — CEFAZOLIN SODIUM-DEXTROSE 2-4 GM/100ML-% IV SOLN
2.0000 g | INTRAVENOUS | Status: AC
  Administered 2015-12-31: 2 g via INTRAVENOUS
  Filled 2015-12-31: qty 100

## 2015-12-31 MED ORDER — DEXTROSE 5 % IV SOLN
500.0000 mg | Freq: Four times a day (QID) | INTRAVENOUS | Status: DC | PRN
  Filled 2015-12-31: qty 5

## 2015-12-31 MED ORDER — KETOROLAC TROMETHAMINE 15 MG/ML IJ SOLN
7.5000 mg | Freq: Four times a day (QID) | INTRAMUSCULAR | Status: AC
  Administered 2016-01-01 (×4): 7.5 mg via INTRAVENOUS
  Filled 2015-12-31 (×4): qty 1

## 2015-12-31 MED ORDER — SODIUM CHLORIDE 0.9 % IJ SOLN
INTRAMUSCULAR | Status: DC | PRN
  Administered 2015-12-31: 30 mL

## 2015-12-31 MED ORDER — CHLORHEXIDINE GLUCONATE 4 % EX LIQD: 60.0000 mL | Freq: Once | CUTANEOUS | Status: DC

## 2015-12-31 MED ORDER — BUPIVACAINE HCL (PF) 0.5 % IJ SOLN
INTRAMUSCULAR | Status: DC | PRN
  Administered 2015-12-31: 3 mL

## 2015-12-31 MED ORDER — MEPERIDINE HCL 50 MG/ML IJ SOLN: 6.2500 mg | INTRAMUSCULAR | Status: DC | PRN

## 2015-12-31 MED ORDER — SENNA 8.6 MG PO TABS
2.0000 | ORAL_TABLET | Freq: Every day | ORAL | Status: DC
  Administered 2016-01-01 (×2): 17.2 mg via ORAL
  Filled 2015-12-31 (×2): qty 2

## 2015-12-31 MED ORDER — PHENYLEPHRINE HCL 10 MG/ML IJ SOLN
INTRAMUSCULAR | Status: AC
  Filled 2015-12-31: qty 1

## 2015-12-31 MED ORDER — BUPIVACAINE HCL (PF) 0.25 % IJ SOLN
INTRAMUSCULAR | Status: DC | PRN
  Administered 2015-12-31: 30 mL

## 2015-12-31 MED ORDER — SODIUM CHLORIDE 0.9 % IR SOLN
Status: DC | PRN
  Administered 2015-12-31: 4000 mL

## 2015-12-31 MED ORDER — KETOROLAC TROMETHAMINE 30 MG/ML IJ SOLN
INTRAMUSCULAR | Status: DC | PRN
  Administered 2015-12-31: 30 mg

## 2015-12-31 MED ORDER — POVIDONE-IODINE 10 % EX SWAB: 2.0000 "application " | Freq: Once | CUTANEOUS | Status: DC

## 2015-12-31 MED ORDER — FENTANYL CITRATE (PF) 100 MCG/2ML IJ SOLN
INTRAMUSCULAR | Status: DC | PRN
  Administered 2015-12-31 (×2): 50 ug via INTRAVENOUS

## 2015-12-31 MED ORDER — HYDROGEN PEROXIDE 3 % EX SOLN
CUTANEOUS | Status: DC | PRN
  Administered 2015-12-31: 1

## 2015-12-31 MED ORDER — PRAVASTATIN SODIUM 20 MG PO TABS
40.0000 mg | ORAL_TABLET | Freq: Every day | ORAL | Status: DC
  Administered 2016-01-01: 40 mg via ORAL
  Filled 2015-12-31: qty 2

## 2015-12-31 MED ORDER — PHENOL 1.4 % MT LIQD: 1.0000 | OROMUCOSAL | Status: DC | PRN

## 2015-12-31 MED ORDER — FENTANYL CITRATE (PF) 100 MCG/2ML IJ SOLN: 25.0000 ug | INTRAMUSCULAR | Status: DC | PRN

## 2015-12-31 MED ORDER — KETOROLAC TROMETHAMINE 30 MG/ML IJ SOLN
INTRAMUSCULAR | Status: AC
  Filled 2015-12-31: qty 1

## 2015-12-31 MED ORDER — ACETAMINOPHEN 10 MG/ML IV SOLN
1000.0000 mg | INTRAVENOUS | Status: AC
  Administered 2015-12-31: 1000 mg via INTRAVENOUS
  Filled 2015-12-31: qty 100

## 2015-12-31 MED ORDER — MIDAZOLAM HCL 2 MG/2ML IJ SOLN
INTRAMUSCULAR | Status: AC
Start: 2015-12-31 — End: 2015-12-31
  Filled 2015-12-31: qty 2

## 2015-12-31 MED ORDER — OXYBUTYNIN CHLORIDE ER 5 MG PO TB24
5.0000 mg | ORAL_TABLET | Freq: Every day | ORAL | Status: DC
  Administered 2016-01-01 – 2016-01-02 (×2): 5 mg via ORAL
  Filled 2015-12-31 (×2): qty 1

## 2015-12-31 MED ORDER — MENTHOL 3 MG MT LOZG: 1.0000 | LOZENGE | OROMUCOSAL | Status: DC | PRN

## 2015-12-31 MED ORDER — DIPHENHYDRAMINE HCL 12.5 MG/5ML PO ELIX: 12.5000 mg | ORAL_SOLUTION | ORAL | Status: DC | PRN

## 2015-12-31 MED ORDER — WARFARIN SODIUM 3 MG PO TABS
3.0000 mg | ORAL_TABLET | ORAL | Status: AC
  Administered 2016-01-01: 3 mg via ORAL
  Filled 2015-12-31: qty 1

## 2015-12-31 MED ORDER — METOCLOPRAMIDE HCL 5 MG PO TABS: 5.0000 mg | ORAL_TABLET | Freq: Three times a day (TID) | ORAL | Status: DC | PRN

## 2015-12-31 MED ORDER — PANTOPRAZOLE SODIUM 40 MG PO TBEC
40.0000 mg | DELAYED_RELEASE_TABLET | Freq: Every day | ORAL | Status: DC
  Administered 2016-01-01 – 2016-01-02 (×2): 40 mg via ORAL
  Filled 2015-12-31 (×2): qty 1

## 2015-12-31 MED ORDER — PROPOFOL 10 MG/ML IV BOLUS
INTRAVENOUS | Status: AC
  Filled 2015-12-31: qty 20

## 2015-12-31 MED ORDER — METOCLOPRAMIDE HCL 5 MG/ML IJ SOLN: 5.0000 mg | Freq: Three times a day (TID) | INTRAMUSCULAR | Status: DC | PRN

## 2015-12-31 MED ORDER — PHENYLEPHRINE 40 MCG/ML (10ML) SYRINGE FOR IV PUSH (FOR BLOOD PRESSURE SUPPORT)
PREFILLED_SYRINGE | INTRAVENOUS | Status: DC | PRN
  Administered 2015-12-31 (×3): 80 ug via INTRAVENOUS

## 2015-12-31 MED ORDER — DEXAMETHASONE SODIUM PHOSPHATE 10 MG/ML IJ SOLN
INTRAMUSCULAR | Status: AC
  Filled 2015-12-31: qty 1

## 2015-12-31 MED ORDER — SODIUM CHLORIDE 0.9 % IV SOLN
INTRAVENOUS | Status: DC
  Administered 2015-12-31 – 2016-01-01 (×3): via INTRAVENOUS

## 2015-12-31 MED ORDER — BUPIVACAINE HCL (PF) 0.25 % IJ SOLN
INTRAMUSCULAR | Status: AC
Start: 2015-12-31 — End: 2015-12-31
  Filled 2015-12-31: qty 30

## 2015-12-31 MED ORDER — WARFARIN - PHARMACIST DOSING INPATIENT: Freq: Every day | Status: DC

## 2015-12-31 MED ORDER — DOCUSATE SODIUM 100 MG PO CAPS
100.0000 mg | ORAL_CAPSULE | Freq: Two times a day (BID) | ORAL | Status: DC
  Administered 2016-01-01 – 2016-01-02 (×4): 100 mg via ORAL
  Filled 2015-12-31 (×4): qty 1

## 2015-12-31 MED ORDER — PHENYLEPHRINE HCL 10 MG/ML IJ SOLN: 30.0000 ug/min | INTRAVENOUS | Status: DC

## 2015-12-31 MED ORDER — ONDANSETRON HCL 4 MG/2ML IJ SOLN: 4.0000 mg | Freq: Four times a day (QID) | INTRAMUSCULAR | Status: DC | PRN

## 2015-12-31 MED ORDER — FENTANYL CITRATE (PF) 100 MCG/2ML IJ SOLN
INTRAMUSCULAR | Status: AC
  Filled 2015-12-31: qty 2

## 2015-12-31 MED ORDER — DEXAMETHASONE SODIUM PHOSPHATE 10 MG/ML IJ SOLN
10.0000 mg | Freq: Once | INTRAMUSCULAR | Status: AC
  Administered 2016-01-01: 10 mg via INTRAVENOUS
  Filled 2015-12-31: qty 1

## 2015-12-31 MED ORDER — POLYETHYLENE GLYCOL 3350 17 G PO PACK: 17.0000 g | PACK | Freq: Every day | ORAL | Status: DC | PRN

## 2015-12-31 MED ORDER — LACTATED RINGERS IV SOLN
INTRAVENOUS | Status: DC
  Administered 2015-12-31 (×2): via INTRAVENOUS

## 2015-12-31 MED ORDER — PHENYLEPHRINE 40 MCG/ML (10ML) SYRINGE FOR IV PUSH (FOR BLOOD PRESSURE SUPPORT)
PREFILLED_SYRINGE | INTRAVENOUS | Status: AC
  Filled 2015-12-31: qty 10

## 2015-12-31 MED ORDER — HYDROMORPHONE HCL 1 MG/ML IJ SOLN: 0.5000 mg | INTRAMUSCULAR | Status: DC | PRN

## 2015-12-31 MED ORDER — ACETAMINOPHEN 650 MG RE SUPP: 650.0000 mg | Freq: Four times a day (QID) | RECTAL | Status: DC | PRN

## 2015-12-31 MED ORDER — ONDANSETRON HCL 4 MG PO TABS
4.0000 mg | ORAL_TABLET | Freq: Four times a day (QID) | ORAL | Status: DC | PRN
  Administered 2016-01-01: 4 mg via ORAL
  Filled 2015-12-31: qty 1

## 2015-12-31 MED ORDER — TRIAMTERENE-HCTZ 37.5-25 MG PO TABS
0.5000 | ORAL_TABLET | Freq: Every day | ORAL | Status: DC
  Administered 2016-01-02: 0.5 via ORAL
  Filled 2015-12-31 (×2): qty 0.5

## 2015-12-31 MED ORDER — ENOXAPARIN SODIUM 40 MG/0.4ML ~~LOC~~ SOLN
40.0000 mg | SUBCUTANEOUS | Status: DC
  Administered 2016-01-01 – 2016-01-02 (×2): 40 mg via SUBCUTANEOUS
  Filled 2015-12-31 (×2): qty 0.4

## 2015-12-31 MED ORDER — METHOCARBAMOL 500 MG PO TABS
500.0000 mg | ORAL_TABLET | Freq: Four times a day (QID) | ORAL | Status: DC | PRN
  Administered 2016-01-01 (×2): 500 mg via ORAL
  Filled 2015-12-31 (×2): qty 1

## 2015-12-31 MED ORDER — PROPOFOL 10 MG/ML IV BOLUS
INTRAVENOUS | Status: DC | PRN
  Administered 2015-12-31: 10 mg via INTRAVENOUS

## 2015-12-31 MED ORDER — ACETAMINOPHEN 10 MG/ML IV SOLN
INTRAVENOUS | Status: AC
  Filled 2015-12-31: qty 100

## 2015-12-31 MED ORDER — DEXAMETHASONE SODIUM PHOSPHATE 10 MG/ML IJ SOLN
INTRAMUSCULAR | Status: DC | PRN
  Administered 2015-12-31: 10 mg via INTRAVENOUS

## 2015-12-31 MED ORDER — TRANEXAMIC ACID 1000 MG/10ML IV SOLN
1000.0000 mg | INTRAVENOUS | Status: AC
  Administered 2015-12-31: 1000 mg via INTRAVENOUS
  Filled 2015-12-31: qty 1100

## 2015-12-31 MED ORDER — POVIDONE-IODINE 10 % EX SOLN
CUTANEOUS | Status: DC | PRN
  Administered 2015-12-31: 1 via TOPICAL

## 2015-12-31 MED ORDER — BUPIVACAINE HCL (PF) 0.5 % IJ SOLN
INTRAMUSCULAR | Status: AC
  Filled 2015-12-31: qty 30

## 2015-12-31 MED ORDER — SODIUM CHLORIDE 0.9 % IJ SOLN
INTRAMUSCULAR | Status: AC
Start: 2015-12-31 — End: 2015-12-31
  Filled 2015-12-31: qty 50

## 2015-12-31 MED ORDER — METOCLOPRAMIDE HCL 5 MG/ML IJ SOLN: 10.0000 mg | Freq: Once | INTRAMUSCULAR | Status: DC | PRN

## 2015-12-31 MED ORDER — PHENYLEPHRINE HCL 10 MG/ML IJ SOLN
INTRAVENOUS | Status: DC | PRN
  Administered 2015-12-31: 10 ug/min via INTRAVENOUS

## 2015-12-31 MED ORDER — ONDANSETRON HCL 4 MG/2ML IJ SOLN
INTRAMUSCULAR | Status: AC
  Filled 2015-12-31: qty 2

## 2015-12-31 MED ORDER — METOPROLOL SUCCINATE ER 50 MG PO TB24
100.0000 mg | ORAL_TABLET | Freq: Every day | ORAL | Status: DC
  Administered 2016-01-01: 100 mg via ORAL
  Filled 2015-12-31: qty 2

## 2015-12-31 MED ORDER — CEFAZOLIN SODIUM-DEXTROSE 2-4 GM/100ML-% IV SOLN
INTRAVENOUS | Status: AC
  Filled 2015-12-31: qty 100

## 2015-12-31 MED ORDER — WATER FOR IRRIGATION, STERILE IR SOLN
Status: DC | PRN
  Administered 2015-12-31: 2000 mL

## 2015-12-31 MED ORDER — SODIUM CHLORIDE 0.9 % IV SOLN: INTRAVENOUS | Status: DC

## 2015-12-31 MED ORDER — HYDROCODONE-ACETAMINOPHEN 7.5-325 MG PO TABS
1.0000 | ORAL_TABLET | ORAL | Status: DC | PRN
  Administered 2016-01-01 (×2): 2 via ORAL
  Administered 2016-01-01: 1 via ORAL
  Administered 2016-01-01: 2 via ORAL
  Administered 2016-01-02: 1 via ORAL
  Filled 2015-12-31: qty 1
  Filled 2015-12-31 (×5): qty 2

## 2015-12-31 MED ORDER — SODIUM CHLORIDE 0.9 % IR SOLN
Status: DC | PRN
  Administered 2015-12-31: 1000 mL

## 2015-12-31 MED ORDER — CEFAZOLIN SODIUM-DEXTROSE 2-4 GM/100ML-% IV SOLN
2.0000 g | Freq: Four times a day (QID) | INTRAVENOUS | Status: AC
  Administered 2016-01-01 (×2): 2 g via INTRAVENOUS
  Filled 2015-12-31 (×2): qty 100

## 2015-12-31 MED ORDER — ACETAMINOPHEN 325 MG PO TABS: 650.0000 mg | ORAL_TABLET | Freq: Four times a day (QID) | ORAL | Status: DC | PRN

## 2015-12-31 MED ORDER — MIDAZOLAM HCL 5 MG/5ML IJ SOLN
INTRAMUSCULAR | Status: DC | PRN
  Administered 2015-12-31 (×2): 1 mg via INTRAVENOUS

## 2015-12-31 MED ORDER — PROPOFOL 500 MG/50ML IV EMUL
INTRAVENOUS | Status: DC | PRN
  Administered 2015-12-31: 75 ug/kg/min via INTRAVENOUS

## 2015-12-31 SURGICAL SUPPLY — 46 items
CAPT HIP TOTAL 2 ×3 IMPLANT
CHLORAPREP W/TINT 26ML (MISCELLANEOUS) ×3 IMPLANT
CLOTH BEACON ORANGE TIMEOUT ST (SAFETY) ×3 IMPLANT
COVER PERINEAL POST (MISCELLANEOUS) ×3 IMPLANT
DECANTER SPIKE VIAL GLASS SM (MISCELLANEOUS) ×6 IMPLANT
DERMABOND ADVANCED (GAUZE/BANDAGES/DRESSINGS) ×4
DERMABOND ADVANCED .7 DNX12 (GAUZE/BANDAGES/DRESSINGS) ×2 IMPLANT
DRAPE SHEET LG 3/4 BI-LAMINATE (DRAPES) ×6 IMPLANT
DRAPE STERI IOBAN 125X83 (DRAPES) ×3 IMPLANT
DRAPE U-SHAPE 47X51 STRL (DRAPES) ×6 IMPLANT
DRSG AQUACEL AG ADV 3.5X10 (GAUZE/BANDAGES/DRESSINGS) ×3 IMPLANT
ELECT PENCIL ROCKER SW 15FT (MISCELLANEOUS) ×3 IMPLANT
ELECT REM PT RETURN 15FT ADLT (MISCELLANEOUS) ×3 IMPLANT
GAUZE SPONGE 4X4 12PLY STRL (GAUZE/BANDAGES/DRESSINGS) ×3 IMPLANT
GLOVE BIO SURGEON STRL SZ8.5 (GLOVE) ×9 IMPLANT
GLOVE BIOGEL PI IND STRL 7.5 (GLOVE) ×5 IMPLANT
GLOVE BIOGEL PI IND STRL 8.5 (GLOVE) ×2 IMPLANT
GLOVE BIOGEL PI INDICATOR 7.5 (GLOVE) ×10
GLOVE BIOGEL PI INDICATOR 8.5 (GLOVE) ×4
GLOVE ORTHO TXT STRL SZ7.5 (GLOVE) ×3 IMPLANT
GLOVE SURG SS PI 7.0 STRL IVOR (GLOVE) ×3 IMPLANT
GLOVE SURG SS PI 7.5 STRL IVOR (GLOVE) ×6 IMPLANT
GOWN SPEC L3 XXLG W/TWL (GOWN DISPOSABLE) ×6 IMPLANT
GOWN STRL REUS W/TWL LRG LVL3 (GOWN DISPOSABLE) ×6 IMPLANT
HANDPIECE INTERPULSE COAX TIP (DISPOSABLE) ×2
HOLDER FOLEY CATH W/STRAP (MISCELLANEOUS) ×3 IMPLANT
HOOD PEEL AWAY FLYTE STAYCOOL (MISCELLANEOUS) ×6 IMPLANT
LIQUID BAND (GAUZE/BANDAGES/DRESSINGS) ×3 IMPLANT
MARKER SKIN DUAL TIP RULER LAB (MISCELLANEOUS) ×3 IMPLANT
NEEDLE SPNL 18GX3.5 QUINCKE PK (NEEDLE) ×3 IMPLANT
PACK ANTERIOR HIP CUSTOM (KITS) ×3 IMPLANT
SAW OSC TIP CART 19.5X105X1.3 (SAW) ×3 IMPLANT
SEALER BIPOLAR AQUA 6.0 (INSTRUMENTS) ×3 IMPLANT
SET HNDPC FAN SPRY TIP SCT (DISPOSABLE) ×1 IMPLANT
SOL PREP POV-IOD 4OZ 10% (MISCELLANEOUS) ×3 IMPLANT
SUT ETHIBOND NAB CT1 #1 30IN (SUTURE) ×6 IMPLANT
SUT MNCRL AB 3-0 PS2 18 (SUTURE) ×3 IMPLANT
SUT MON AB 2-0 CT1 36 (SUTURE) ×6 IMPLANT
SUT STRATAFIX PDO 1 14 VIOLET (SUTURE) ×2
SUT STRATFX PDO 1 14 VIOLET (SUTURE) ×1
SUT VIC AB 2-0 CT1 27 (SUTURE) ×2
SUT VIC AB 2-0 CT1 TAPERPNT 27 (SUTURE) ×1 IMPLANT
SUTURE STRATFX PDO 1 14 VIOLET (SUTURE) ×1 IMPLANT
SYR 50ML LL SCALE MARK (SYRINGE) ×3 IMPLANT
TRAY FOLEY CATH SILVER 14FR (SET/KITS/TRAYS/PACK) ×3 IMPLANT
YANKAUER SUCT BULB TIP 10FT TU (MISCELLANEOUS) ×3 IMPLANT

## 2015-12-31 NOTE — Interval H&P Note (Signed)
History and Physical Interval Note:  12/31/2015 4:37 PM  Ebony Mason  has presented today for surgery, with the diagnosis of DJD Right Hip  The various methods of treatment have been discussed with the patient and family. After consideration of risks, benefits and other options for treatment, the patient has consented to  Procedure(s) with comments: RIGHT TOTAL HIP ARTHROPLASTY ANTERIOR APPROACH (Right) - Needs RNFA as a surgical intervention .  The patient's history has been reviewed, patient examined, no change in status, stable for surgery.  I have reviewed the patient's chart and labs.  Questions were answered to the patient's satisfaction.     [name], Horald Pollen

## 2015-12-31 NOTE — H&P (View-Only) (Signed)
TOTAL HIP ADMISSION H&P  Patient is admitted for right total hip arthroplasty.  Subjective:  Chief Complaint: right hip pain  HPI: Ebony Mason, 75 y.o. female, has a history of pain and functional disability in the right hip(s) due to arthritis and patient has failed non-surgical conservative treatments for greater than 12 weeks to include NSAID's and/or analgesics, flexibility and strengthening excercises, use of assistive devices and activity modification.  Onset of symptoms was gradual starting 3 years ago with gradually worsening course since that time.The patient noted no past surgery on the right hip(s).  Patient currently rates pain in the right hip at 10 out of 10 with activity. Patient has night pain, worsening of pain with activity and weight bearing, pain that interfers with activities of daily living and pain with passive range of motion. Patient has evidence of subchondral sclerosis, periarticular osteophytes and joint space narrowing by imaging studies. This condition presents safety issues increasing the risk of falls. There is no current active infection.  Patient Active Problem List   Diagnosis Date Noted  . Prediabetes 07/27/2015  . Hereditary and idiopathic peripheral neuropathy 04/23/2015  . Abnormality of gait 03/17/2015  . Paresthesia 09/08/2014  . Midline low back pain without sciatica 09/08/2014  . Peripheral neuropathy ([place]) 02/15/2013  . Chest pain 10/15/2012  . Long term (current) use of anticoagulants 10/02/2012  . Paroxysmal atrial fibrillation ([place]) 09/27/2012  . Essential hypertension 09/27/2012  . Hypertension    Past Medical History:  Diagnosis Date  . Cataract bilateral  . Hypertension   . OSA (obstructive sleep apnea)   . Paroxysmal atrial fibrillation (HCC)   . Peripheral neuropathy ([place])   . Raynaud's syndrome     Past Surgical History:  Procedure Laterality Date  . ABDOMINAL HYSTERECTOMY    . LOWER EXTREMITY VENOUS DOPPLER  11/18/2010   No  evidence of thrombus or thrombophlebitis. No venous insufficiency was noted.     (Not in a hospital admission) No Known Allergies  Social History  Substance Use Topics  . Smoking status: Never Smoker  . Smokeless tobacco: Not on file  . Alcohol use No    Family History  Problem Relation Age of Onset  . Liver cancer Mother   . Alzheimer's disease Father   . Diabetes Maternal Grandfather      Review of Systems  Constitutional: Negative.   HENT: Positive for tinnitus.   Eyes: Negative.   Respiratory: Positive for shortness of breath.   Cardiovascular: Negative.   Gastrointestinal: Positive for constipation.  Genitourinary: Negative.   Musculoskeletal: Positive for back pain and joint pain.  Skin: Negative.   Neurological: Negative.   Endo/Heme/Allergies: Negative.   Psychiatric/Behavioral: Negative.     Objective:  Physical Exam  Vitals reviewed. Constitutional: She is oriented to person, place, and time. She appears well-developed and well-nourished.  HENT:  Head: Normocephalic and atraumatic.  Eyes: Conjunctivae and EOM are normal. Pupils are equal, round, and reactive to light.  Neck: Normal range of motion. Neck supple.  Cardiovascular: Normal rate, regular rhythm and intact distal pulses.   Respiratory: Effort normal. No respiratory distress.  GI: Soft. She exhibits no distension.  Genitourinary:  Genitourinary Comments: deferred  Neurological: She is alert and oriented to person, place, and time. She has normal reflexes.  Skin: Skin is warm and dry.  Psychiatric: She has a normal mood and affect. Her behavior is normal. Judgment and thought content normal.    Vital signs in last 24 hours: @VSRANGES @  Labs:  Estimated body mass index is 32.2 kg/m as calculated from the following:   Height as of 07/27/15: 5\' 8"  (1.727 m).   Weight as of 07/27/15: 96 kg (211 lb 12 oz).   Imaging Review Plain radiographs demonstrate severe degenerative joint disease of  the right hip(s). The bone quality appears to be adequate for age and reported activity level.  Assessment/Plan:  End stage arthritis, right hip(s)  The patient history, physical examination, clinical judgement of the provider and imaging studies are consistent with end stage degenerative joint disease of the right hip(s) and total hip arthroplasty is deemed medically necessary. The treatment options including medical management, injection therapy, arthroscopy and arthroplasty were discussed at length. The risks and benefits of total hip arthroplasty were presented and reviewed. The risks due to aseptic loosening, infection, stiffness, dislocation/subluxation,  thromboembolic complications and other imponderables were discussed.  The patient acknowledged the explanation, agreed to proceed with the plan and consent was signed. Patient is being admitted for inpatient treatment for surgery, pain control, PT, OT, prophylactic antibiotics, VTE prophylaxis, progressive ambulation and ADL's and discharge planning.The patient is planning to be discharged home with home health services. D/C coumadin 5 days preop.

## 2015-12-31 NOTE — Anesthesia Preprocedure Evaluation (Signed)
Anesthesia Evaluation  Patient identified by MRN, date of birth, ID band Patient awake    Reviewed: Allergy & Precautions, NPO status , Patient's Chart, lab work & pertinent test results  Airway Mallampati: II  TM Distance: >3 FB Neck ROM: Full    Dental no notable dental hx.    Pulmonary sleep apnea ,    Pulmonary exam normal breath sounds clear to auscultation       Cardiovascular hypertension, Pt. on medications Normal cardiovascular exam+ dysrhythmias Atrial Fibrillation  Rhythm:Regular Rate:Normal     Neuro/Psych neuropathy negative neurological ROS  negative psych ROS   GI/Hepatic negative GI ROS, Neg liver ROS,   Endo/Other  negative endocrine ROS  Renal/GU negative Renal ROS  negative genitourinary   Musculoskeletal negative musculoskeletal ROS (+)   Abdominal   Peds negative pediatric ROS (+)  Hematology negative hematology ROS (+)   Anesthesia Other Findings   Reproductive/Obstetrics negative OB ROS                             Anesthesia Physical Anesthesia Plan  ASA: III  Anesthesia Plan: Spinal   Post-op Pain Management:    Induction:   Airway Management Planned: Simple Face Mask  Additional Equipment:   Intra-op Plan:   Post-operative Plan:   Informed Consent: I have reviewed the patients History and Physical, chart, labs and discussed the procedure including the risks, benefits and alternatives for the proposed anesthesia with the patient or authorized representative who has indicated his/her understanding and acceptance.   Dental advisory given  Plan Discussed with: CRNA  Anesthesia Plan Comments:         Anesthesia Quick Evaluation

## 2015-12-31 NOTE — Anesthesia Postprocedure Evaluation (Signed)
Anesthesia Post Note  Patient: Ebony Mason  Procedure(s) Performed: Procedure(s) (LRB): RIGHT TOTAL HIP ARTHROPLASTY ANTERIOR APPROACH (Right)  Patient location during evaluation: PACU Anesthesia Type: MAC and Spinal Level of consciousness: awake, awake and alert and patient cooperative Pain management: pain level controlled Vital Signs Assessment: post-procedure vital signs reviewed and stable Respiratory status: spontaneous breathing, nonlabored ventilation and respiratory function stable Cardiovascular status: blood pressure returned to baseline Postop Assessment: spinal receding Anesthetic complications: no    Last Vitals:  Vitals:   12/31/15 2130 12/31/15 2145  BP: (!) 104/58 (!) 111/59  Pulse: 67 69  Resp: 14 10  Temp:      Last Pain:  Vitals:   12/31/15 1341  TempSrc: Oral                 Ayasha Ellingsen COKER

## 2015-12-31 NOTE — Progress Notes (Signed)
Please note all PACU charting done by Virgina Organ, RN who inadvertently charted under Darrold Span.

## 2015-12-31 NOTE — Discharge Instructions (Signed)
°Dr. Isadora Delorey °Joint Replacement Specialist °[facility] °[address] °[place], [place] [address] °[phone] ° ° °TOTAL HIP REPLACEMENT POSTOPERATIVE DIRECTIONS ° ° ° °Hip Rehabilitation, Guidelines Following Surgery  ° °WEIGHT BEARING °Weight bearing as tolerated with assist device (walker, cane, etc) as directed, use it as long as suggested by your surgeon or therapist, typically at least 4-6 weeks. ° °The results of a hip operation are greatly improved after range of motion and muscle strengthening exercises. Follow all safety measures which are given to protect your hip. If any of these exercises cause increased pain or swelling in your joint, decrease the amount until you are comfortable again. Then slowly increase the exercises. Call your caregiver if you have problems or questions.  ° °HOME CARE INSTRUCTIONS  °Most of the following instructions are designed to prevent the dislocation of your new hip.  °Remove items at home which could result in a fall. This includes throw rugs or furniture in walking pathways.  °Continue medications as instructed at time of discharge. °· You may have some home medications which will be placed on hold until you complete the course of blood thinner medication. °· You may start showering once you are discharged home. Do not remove your dressing. °Do not put on socks or shoes without following the instructions of your caregivers.   °Sit on chairs with arms. Use the chair arms to help push yourself up when arising.  °Arrange for the use of a toilet seat elevator so you are not sitting low.  °· Walk with walker as instructed.  °You may resume a sexual relationship in one month or when given the OK by your caregiver.  °Use walker as long as suggested by your caregivers.  °You may put full weight on your legs and walk as much as is comfortable. °Avoid periods of inactivity such as sitting longer than an hour when not asleep. This helps prevent  blood clots.  °You may return to work once you are cleared by your surgeon.  °Do not drive a car for 6 weeks or until released by your surgeon.  °Do not drive while taking narcotics.  °Wear elastic stockings for two weeks following surgery during the day but you may remove then at night.  °Make sure you keep all of your appointments after your operation with all of your doctors and caregivers. You should call the office at the above phone number and make an appointment for approximately two weeks after the date of your surgery. °Please pick up a stool softener and laxative for home use as long as you are requiring pain medications. °· ICE to the affected hip every three hours for 30 minutes at a time and then as needed for pain and swelling. Continue to use ice on the hip for pain and swelling from surgery. You may notice swelling that will progress down to the foot and ankle.  This is normal after surgery.  Elevate the leg when you are not up walking on it.   °It is important for you to complete the blood thinner medication as prescribed by your doctor. °· Continue to use the breathing machine which will help keep your temperature down.  It is common for your temperature to cycle up and down following surgery, especially at night when you are not up moving around and exerting yourself.  The breathing machine keeps your lungs expanded and your temperature down. ° °RANGE OF MOTION AND STRENGTHENING EXERCISES  °These exercises are   designed to help you keep full movement of your hip joint. Follow your caregiver's or physical therapist's instructions. Perform all exercises about fifteen times, three times per day or as directed. Exercise both hips, even if you have had only one joint replacement. These exercises can be done on a training (exercise) mat, on the floor, on a table or on a bed. Use whatever works the best and is most comfortable for you. Use music or television while you are exercising so that the exercises  are a pleasant break in your day. This will make your life better with the exercises acting as a break in routine you can look forward to.  °Lying on your back, slowly slide your foot toward your buttocks, raising your knee up off the floor. Then slowly slide your foot back down until your leg is straight again.  °Lying on your back spread your legs as far apart as you can without causing discomfort.  °Lying on your side, raise your upper leg and foot straight up from the floor as far as is comfortable. Slowly lower the leg and repeat.  °Lying on your back, tighten up the muscle in the front of your thigh (quadriceps muscles). You can do this by keeping your leg straight and trying to raise your heel off the floor. This helps strengthen the largest muscle supporting your knee.  °Lying on your back, tighten up the muscles of your buttocks both with the legs straight and with the knee bent at a comfortable angle while keeping your heel on the floor.  ° °SKILLED REHAB INSTRUCTIONS: °If the patient is transferred to a skilled rehab facility following release from the hospital, a list of the current medications will be sent to the facility for the patient to continue.  When discharged from the skilled rehab facility, please have the facility set up the patient's Home Health Physical Therapy prior to being released. Also, the skilled facility will be responsible for providing the patient with their medications at time of release from the facility to include their pain medication and their blood thinner medication. If the patient is still at the rehab facility at time of the two week follow up appointment, the skilled rehab facility will also need to assist the patient in arranging follow up appointment in our office and any transportation needs. ° °MAKE SURE YOU:  °Understand these instructions.  °Will watch your condition.  °Will get help right away if you are not doing well or get worse. ° °Pick up stool softner and  laxative for home use following surgery while on pain medications. °Do not remove your dressing. °The dressing is waterproof--it is OK to take showers. °Continue to use ice for pain and swelling after surgery. °Do not use any lotions or creams on the incision until instructed by your surgeon. °Total Hip Protocol. ° ° °

## 2015-12-31 NOTE — Progress Notes (Signed)
ANTICOAGULATION CONSULT NOTE - Initial Consult  Pharmacy Consult for warfarin Indication: atrial fibrillation/VTE post-op  No Known Allergies  Patient Measurements: Height: 5\' 6"  (167.6 cm) Weight: 213 lb (96.6 kg) IBW/kg (Calculated) : 59.3   Vital Signs: Temp: 97.6 F (36.4 C) (10/19 2300) Temp Source: Oral (10/19 2300) BP: 133/66 (10/19 2300) Pulse Rate: 69 (10/19 2300)  Labs:  Recent Labs  12/31/15 1408  LABPROT 15.2  INR 1.19    Estimated Creatinine Clearance: 35.8 mL/min (by C-G formula based on SCr of 1.54 mg/dL (H)).   Medical History: Past Medical History:  Diagnosis Date  . Arthritis    right hip  . Cataract bilateral  . GERD (gastroesophageal reflux disease)   . Hypertension   . OSA (obstructive sleep apnea)    cpap used nightly  . Paroxysmal atrial fibrillation (HCC)    Dr. Talmadge Chad  . Peripheral neuropathy (HCC)    bilateral feet  . Raynaud's syndrome   . Raynaud's syndrome    fingers and toes    Assessment: 52 yoF on chronic warfarin for A-fib now s/p right THA.  Warfarin per Rx. HD 1.25 mg M/W/F and 2.5 mg Sun/Tues/Thur/Sat  LD 10/13 Goal of Therapy:  INR 2-3    Plan:  Warfarin 3mg  x1 tonight Daily PT/INR  Lawana Pai R 12/31/2015,11:34 PM

## 2015-12-31 NOTE — Op Note (Signed)
OPERATIVE REPORT  SURGEON: Rod Can, MD   ASSISTANT: Merla Riches, PA-C.  PREOPERATIVE DIAGNOSIS: Right hip arthritis.   POSTOPERATIVE DIAGNOSIS: Right hip arthritis.   PROCEDURE: Right total hip arthroplasty, anterior approach.   IMPLANTS: DePuy Tri Lock stem, size 4, std offset. DePuy Pinnacle Cup, size 52 mm. DePuy Altrx liner, size 32 by 52 mm, neutral. DePuy Biolox ceramic head ball, size 32 + 1 mm.  ANESTHESIA:  Spinal  ESTIMATED BLOOD LOSS: 40  ML.  ANTIBIOTICS: 2 g Ancef.  DRAINS: None.  COMPLICATIONS: None.   CONDITION: PACU - hemodynamically stable.Marland Kitchen   BRIEF CLINICAL NOTE: Ebony Mason is a 75 y.o. female with a long-standing history of Right hip arthritis. After failing conservative management, the patient was indicated for total hip arthroplasty. The risks, benefits, and alternatives to the procedure were explained, and the patient elected to proceed.  PROCEDURE IN DETAIL: Surgical site was marked by myself. Spinal anesthesia was obtained in the pre-op holding area. Once inside the operative room, a foley catheter was inserted. The patient was then positioned on the Hana table. All bony prominences were well padded. The hip was prepped and draped in the normal sterile surgical fashion. A time-out was called verifying side and site of surgery. The patient received IV antibiotics within 60 minutes of beginning the procedure.  The direct anterior approach to the hip was performed through the Hueter interval. Lateral femoral circumflex vessels were treated with the Auqumantys. The anterior capsule was exposed and an inverted T capsulotomy was made.The femoral neck cut was made to the level of the templated cut. A corkscrew was placed into the head and the head was removed. The femoral head was found to have eburnated bone. The head was passed to the back table and was measured.  Acetabular exposure was achieved, and the pulvinar and labrum were  excised. Sequental reaming of the acetabulum was then performed up to a size 51 mm reamer. A 52 mm cup was then opened and impacted into place at approximately 40 degrees of abduction and 20 degrees of anteversion. The final polyethylene liner was impacted into place and acetabular osteophytes were removed.   I then gained femoral exposure taking care to protect the abductors and greater trochanter. This was performed using standard external rotation, extension, and adduction. The capsule was peeled off the inner aspect of the greater trochanter, taking care to preserve the short external rotators. A cookie cutter was used to enter the femoral canal, and then the femoral canal finder was placed. Sequential broaching was performed up to a size 4. Calcar planer was used on the femoral neck remnant. I placed a std offset neck and a trial head ball. The hip was reduced. Leg lengths and offset were checked fluoroscopically. The hip was dislocated and trial components were removed. The final implants were placed, and the hip was reduced.  Fluoroscopy was used to confirm component position and leg lengths. At 90 degrees of external rotation and full extension, the hip was stable to an anterior directed force.  The wound was copiously irrigated with a dilute betadine solution followed by normal saline. Marcaine solution was injected into the periarticular soft tissue. The wound was closed in layers using #1 Vicryl and Stratafix for the fascia, 2-0 Vicryl for the subcutaneous fat, 2-0 Monocryl for the deep dermal layer, 3-0 running Monocryl subcuticular stitch, and Dermabond for the skin. Once the glue was fully dried, an Aquacell Ag dressing was applied. The patient was transported to the  recovery room in stable condition. Sponge, needle, and instrument counts were correct at the end of the case x2. The patient tolerated the procedure well and there were no known complications.  Please note that a  surgical assistant was a medical necessity for this procedure to perform it in a safe and expeditious manner. Assistant was necessary to provide appropriate retraction of vital neurovascular structures, to prevent femoral fracture, and to allow for anatomic placement of the prosthesis.

## 2015-12-31 NOTE — Anesthesia Procedure Notes (Signed)
Spinal  Patient location during procedure: OR End time: 12/31/2015 5:31 PM Staffing Anesthesiologist: Montez Hageman Resident/CRNA: Lajuana Carry E Performed: resident/CRNA  Preanesthetic Checklist Completed: patient identified, site marked, surgical consent, pre-op evaluation, timeout performed, IV checked, risks and benefits discussed and monitors and equipment checked Spinal Block Patient position: sitting Prep: Betadine Patient monitoring: heart rate, continuous pulse ox and blood pressure Approach: right paramedian Location: L3-4 Injection technique: single-shot Needle Needle type: Quincke  Needle gauge: 22 G Needle length: 9 cm Additional Notes Kit expiration checked, sitting position, first attempt midline, second attempt right paramedian w/ clear CSF pre/post injection. Neg heme, neg paresthesia. Pt. tol well.

## 2015-12-31 NOTE — Transfer of Care (Signed)
Immediate Anesthesia Transfer of Care Note  Patient: Ebony Mason  Procedure(s) Performed: Procedure(s) with comments: RIGHT TOTAL HIP ARTHROPLASTY ANTERIOR APPROACH (Right) - Needs RNFA  Patient Location: PACU  Anesthesia Type:MAC and Spinal  Level of Consciousness: awake, alert  and oriented  Airway & Oxygen Therapy: Patient Spontanous Breathing and Patient connected to face mask oxygen  Post-op Assessment: Report given to RN and Post -op Vital signs reviewed and stable  Post vital signs: Reviewed and stable  Last Vitals:  Vitals:   12/31/15 1341  BP: (!) 149/88  Pulse: 68  Resp: 20  Temp: 36.7 C    Last Pain:  Vitals:   12/31/15 1341  TempSrc: Oral         Complications: No apparent anesthesia complications

## 2016-01-01 ENCOUNTER — Encounter (HOSPITAL_COMMUNITY): Payer: Self-pay | Admitting: Orthopedic Surgery

## 2016-01-01 LAB — PROTIME-INR
INR: 1.22
PROTHROMBIN TIME: 15.4 s — AB (ref 11.4–15.2)

## 2016-01-01 LAB — BASIC METABOLIC PANEL
ANION GAP: 8 (ref 5–15)
BUN: 18 mg/dL (ref 6–20)
CHLORIDE: 106 mmol/L (ref 101–111)
CO2: 24 mmol/L (ref 22–32)
Calcium: 8.6 mg/dL — ABNORMAL LOW (ref 8.9–10.3)
Creatinine, Ser: 1.44 mg/dL — ABNORMAL HIGH (ref 0.44–1.00)
GFR calc Af Amer: 39 mL/min — ABNORMAL LOW (ref >60)
GFR calc non Af Amer: 34 mL/min — ABNORMAL LOW (ref >60)
GLUCOSE: 242 mg/dL — AB (ref 65–99)
POTASSIUM: 3.9 mmol/L (ref 3.5–5.1)
Sodium: 138 mmol/L (ref 135–145)

## 2016-01-01 LAB — CBC
HEMATOCRIT: 32.6 % — AB (ref 36.0–46.0)
HEMOGLOBIN: 10.3 g/dL — AB (ref 12.0–15.0)
MCH: 28.9 pg (ref 26.0–34.0)
MCHC: 31.6 g/dL (ref 30.0–36.0)
MCV: 91.6 fL (ref 78.0–100.0)
Platelets: 261 10*3/uL (ref 150–400)
RBC: 3.56 MIL/uL — AB (ref 3.87–5.11)
RDW: 15 % (ref 11.5–15.5)
WBC: 12.9 10*3/uL — AB (ref 4.0–10.5)

## 2016-01-01 MED ORDER — ONDANSETRON HCL 4 MG PO TABS: 4.0000 mg | ORAL_TABLET | Freq: Four times a day (QID) | ORAL | 0 refills | Status: DC | PRN

## 2016-01-01 MED ORDER — POLYETHYLENE GLYCOL 3350 17 G PO PACK: 17.0000 g | PACK | Freq: Every day | ORAL | 0 refills | Status: DC | PRN

## 2016-01-01 MED ORDER — HYDROCODONE-ACETAMINOPHEN 7.5-325 MG PO TABS: 1.0000 | ORAL_TABLET | ORAL | 0 refills | Status: DC | PRN

## 2016-01-01 MED ORDER — SENNA 8.6 MG PO TABS: 2.0000 | ORAL_TABLET | Freq: Every day | ORAL | 0 refills | Status: DC

## 2016-01-01 MED ORDER — ENOXAPARIN SODIUM 40 MG/0.4ML ~~LOC~~ SOLN: 40.0000 mg | SUBCUTANEOUS | 0 refills | Status: DC

## 2016-01-01 MED ORDER — WARFARIN SODIUM 2 MG PO TABS
2.0000 mg | ORAL_TABLET | Freq: Once | ORAL | Status: AC
  Administered 2016-01-01: 2 mg via ORAL
  Filled 2016-01-01: qty 1

## 2016-01-01 MED ORDER — METHOCARBAMOL 500 MG PO TABS: 500.0000 mg | ORAL_TABLET | Freq: Four times a day (QID) | ORAL | 0 refills | Status: DC | PRN

## 2016-01-01 MED ORDER — DOCUSATE SODIUM 100 MG PO CAPS: 100.0000 mg | ORAL_CAPSULE | Freq: Two times a day (BID) | ORAL | 0 refills | Status: DC

## 2016-01-01 NOTE — Progress Notes (Signed)
Physical Therapy Treatment Patient Details Name: Ebony Mason MRN: [id number] DOB: [birth year] Today's Date: 01/01/2016    History of Present Illness Pt s/p R THR and with hx of peripheral neuropathy    PT Comments    Pt progressing slowly with mobility but continues to require significant assist of 2 for safe performance of transfers and bed mobility tasks.  Follow Up Recommendations  Home health PT     Equipment Recommendations  None recommended by PT    Recommendations for Other Services OT consult     Precautions / Restrictions Precautions Precautions: Fall Restrictions Weight Bearing Restrictions: No RLE Weight Bearing: Weight bearing as tolerated    Mobility  Bed Mobility Overal bed mobility: Needs Assistance;+2 for physical assistance;+ 2 for safety/equipment Bed Mobility: Sit to Supine       Sit to supine: Mod assist;+2 for physical assistance;+2 for safety/equipment   General bed mobility comments: Increased time and cues for sequence and use of L LE to self assist  Transfers Overall transfer level: Needs assistance Equipment used: None Transfers: Sit to/from Stand Sit to Stand: Mod assist;+2 physical assistance;+2 safety/equipment         General transfer comment: cues for LE management and use of UEs to self assist  Ambulation/Gait Ambulation/Gait assistance: Min assist;+2 safety/equipment Ambulation Distance (Feet): 29 Feet Assistive device: Rolling walker (2 wheeled) Gait Pattern/deviations: Step-to pattern;Shuffle;Trunk flexed Gait velocity: decr Gait velocity interpretation: Below normal speed for age/gender General Gait Details: Increased time and cues for sequence, posture and position from [employer]            Wheelchair Mobility    Modified Rankin (Stroke Patients Only)       Balance                                    Cognition Arousal/Alertness: Awake/alert Behavior During Therapy: WFL for tasks  assessed/performed Overall Cognitive Status: Within Functional Limits for tasks assessed                      Exercises      General Comments        Pertinent Vitals/Pain Pain Assessment: 0-10 Pain Score: 4  Pain Location: R hip Pain Descriptors / Indicators: Aching;Sore Pain Intervention(s): Limited activity within patient's tolerance;Monitored during session;Premedicated before session;Ice applied    Home Living                      Prior Function            PT Goals (current goals can now be found in the care plan section) Acute Rehab PT Goals Patient Stated Goal: Regain IND and resume previous lifestyle PT Goal Formulation: With patient Time For Goal Achievement: 01/06/16 Potential to Achieve Goals: Good Progress towards PT goals: Progressing toward goals    Frequency    7X/week      PT Plan Current plan remains appropriate    Co-evaluation             End of Session Equipment Utilized During Treatment: Gait belt Activity Tolerance: Patient tolerated treatment well;Patient limited by fatigue Patient left: in bed;with call bell/phone within reach;with family/visitor present     Time: [id number] PT Time Calculation (min) (ACUTE ONLY): 23 min  Charges:  $Gait Training: 23-37 mins  G Codes:      [id number] 01-10-2016, 4:00 PM

## 2016-01-01 NOTE — Progress Notes (Signed)
   Subjective:  Patient reports pain as mild to moderate.  No c/o. Denies N/V/CP/SOB.  Objective:   VITALS:   Vitals:   01/01/16 0100 01/01/16 0206 01/01/16 0610 01/01/16 0902  BP: (!) 112/34 (!) 120/42 (!) 108/50 (!) 106/46  Pulse: 64 63 70 70  Resp: 16 16 16 16   Temp: 98.2 F (36.8 C) 98.2 F (36.8 C) 98 F (36.7 C) 98.3 F (36.8 C)  TempSrc: Oral Oral Oral Oral  SpO2: 94% 93% 93% 94%  Weight:      Height:        NAD ABD soft Sensation intact distally Intact pulses distally Dorsiflexion/Plantar flexion intact Incision: dressing C/D/I Compartment soft   Lab Results  Component Value Date   WBC 12.9 (H) 01/01/2016   HGB 10.3 (L) 01/01/2016   HCT 32.6 (L) 01/01/2016   MCV 91.6 01/01/2016   PLT 261 01/01/2016   BMET    Component Value Date/Time   NA 138 01/01/2016 0104   NA 145 (H) 04/23/2015 1246   K 3.9 01/01/2016 0104   CL 106 01/01/2016 0104   CO2 24 01/01/2016 0104   GLUCOSE 242 (H) 01/01/2016 0104   BUN 18 01/01/2016 0104   BUN 18 04/23/2015 1246   CREATININE 1.44 (H) 01/01/2016 0104   CALCIUM 8.6 (L) 01/01/2016 0104   GFRNONAA 34 (L) 01/01/2016 0104   GFRAA 39 (L) 01/01/2016 0104     Assessment/Plan: 1 Day Post-Op   Principal Problem:   Primary osteoarthritis of right hip   WBAT with walker DVT ppx: coumadin (INR 2-3) with lovenox bridge, SCDs, TEDs PO pain control PT/OT DIspo: D/C home tomorrow with HHPT   Geoge Lawrance, Horald Pollen 01/01/2016, 1:55 PM   Rod Can, MD Cell [phone]

## 2016-01-01 NOTE — Evaluation (Signed)
Physical Therapy Evaluation Patient Details Name: Ebony Mason MRN: [id number] DOB: 20-Jan-1941 Today's Date: 01/01/2016   History of Present Illness  Pt s/p R THR and with hx of peripheral neuropathy  Clinical Impression  Pt s/p R THR and presents with decreased R LE strength/ROM, post op pain, elevated anxiety level and obesity limiting functional mobility.  Pt should progress to dc home with family assist and HHPT follow up.    Follow Up Recommendations Home health PT    Equipment Recommendations  None recommended by PT    Recommendations for Other Services OT consult     Precautions / Restrictions Precautions Precautions: Fall Restrictions Weight Bearing Restrictions: No RLE Weight Bearing: Weight bearing as tolerated      Mobility  Bed Mobility Overal bed mobility: Needs Assistance;+2 for physical assistance;+ 2 for safety/equipment Bed Mobility: Supine to Sit     Supine to sit: Min assist;Mod assist;+2 for safety/equipment;+2 for physical assistance     General bed mobility comments: Increased time and cues for sequence and use of L LE to self assist  Transfers Overall transfer level: Needs assistance Equipment used: Rolling walker (2 wheeled) Transfers: Sit to/from Stand Sit to Stand: Min assist;Mod assist;+2 physical assistance;+2 safety/equipment         General transfer comment: cues for LE management and use of UEs to self assist  Ambulation/Gait Ambulation/Gait assistance: Min assist;Mod assist;+2 safety/equipment Ambulation Distance (Feet): 18 Feet Assistive device: Rolling walker (2 wheeled) Gait Pattern/deviations: Step-to pattern;Decreased step length - right;Decreased step length - left;Shuffle;Trunk flexed Gait velocity: decr Gait velocity interpretation: Below normal speed for age/gender General Gait Details: Increased time and cues for sequence, posture and position from [employer]            Wheelchair Mobility    Modified  Rankin (Stroke Patients Only)       Balance                                             Pertinent Vitals/Pain Pain Assessment: 0-10 Pain Score: 3  Pain Location: R hip Pain Descriptors / Indicators: Aching;Sore Pain Intervention(s): Limited activity within patient's tolerance;Monitored during session;Premedicated before session;Ice applied    Home Living Family/patient expects to be discharged to:: Private residence Living Arrangements: Spouse/significant other Available Help at Discharge: Family Type of Home: House Home Access: [place]: One [place]: Environmental consultant - 2 wheels;Walker - 4 wheels      Prior Function Level of Independence: Independent;Independent with assistive device(s)               Hand Dominance        Extremity/Trunk Assessment   Upper Extremity Assessment: Generalized weakness           Lower Extremity Assessment: Generalized weakness         Communication   Communication: No difficulties  Cognition Arousal/Alertness: Awake/alert Behavior During Therapy: WFL for tasks assessed/performed Overall Cognitive Status: Within Functional Limits for tasks assessed                      General Comments      Exercises Total Joint Exercises Ankle Circles/Pumps: AROM;Both;15 reps;Supine Quad Sets: AROM;Both;10 reps;Supine Heel Slides: AAROM;Right;15 reps;Supine Hip ABduction/ADduction: AAROM;Right;10 reps;Supine   Assessment/Plan    PT Assessment Patient needs continued PT services  PT  Problem List Decreased strength;Decreased range of motion;Decreased activity tolerance;Decreased balance;Decreased mobility;Decreased knowledge of use of DME;Obesity;Pain          PT Treatment Interventions DME instruction;Gait training;Stair training;Functional mobility training;Therapeutic activities;Therapeutic exercise;Patient/family education    PT Goals (Current goals can be found in the  Care Plan section)  Acute Rehab PT Goals Patient Stated Goal: Regain IND and resume previous lifestyle PT Goal Formulation: With patient Time For Goal Achievement: 01/06/16 Potential to Achieve Goals: Good    Frequency 7X/week   Barriers to discharge        Co-evaluation               End of Session Equipment Utilized During Treatment: Gait belt Activity Tolerance: Patient tolerated treatment well;Patient limited by fatigue Patient left: in chair;with call bell/phone within reach;with family/visitor present Nurse Communication: Mobility status         Time: [id number] PT Time Calculation (min) (ACUTE ONLY): 33 min   Charges:   PT Evaluation $PT Eval Low Complexity: 1 Procedure PT Treatments $Therapeutic Exercise: 8-22 mins   PT G Codes:        Ebony Mason 17-Jan-2016, 12:52 PM

## 2016-01-01 NOTE — Progress Notes (Signed)
ANTICOAGULATION CONSULT NOTE - Follow Up Consult  Pharmacy Consult for warfarin Indication: atrial fibrillation and VTE prophylaxis (s/p R THA)  No Known Allergies  Patient Measurements: Height: 5\' 6"  (167.6 cm) Weight: 213 lb (96.6 kg) IBW/kg (Calculated) : 59.3   Vital Signs: Temp: 98.3 F (36.8 C) (10/20 0902) Temp Source: Oral (10/20 0902) BP: 106/46 (10/20 0902) Pulse Rate: 70 (10/20 0902)  Labs:  Recent Labs  12/31/15 1408 01/01/16 0104  HGB  --  10.3*  HCT  --  32.6*  PLT  --  261  LABPROT 15.2 15.4*  INR 1.19 1.22  CREATININE  --  1.44*    Estimated Creatinine Clearance: 38.3 mL/min (by C-G formula based on SCr of 1.44 mg/dL (H)).   Medications:  Home warfarin regimen: 2.5 mg daily except 1.25 mg on MWF (last dose 10/13 PTA)  Assessment: Patient is a 75 y.o F with his Afib on warfarin PTA, [facility] to [facility] 10/[facility] for R THA.  Warfarin resumed post-op.   Today, 01/01/2016: - INR up slightly to 1.22 with 3 mg dose given last night - cbc ok - no bleeding documented - no significant drug-drug intxns   Goal of Therapy:  INR 2-3 Monitor platelets by anticoagulation protocol: Yes   Plan:  - warfarin 2 mg PO x1 today - continue lovenox 40 mg SQ q24h (Discontinue when INR > 1.8) - monitor for s/s bleeding  Susy Placzek P 01/01/2016,9:44 AM

## 2016-01-01 NOTE — Discharge Summary (Signed)
Physician Discharge Summary  Patient ID: Ebony Mason MRN: [id number] DOB/AGE: 04-08-1940 75 y.o.  Admit date: 12/31/2015 Discharge date: 01/02/2016  Admission Diagnoses:  Primary osteoarthritis of right hip  Discharge Diagnoses:  Principal Problem:   Primary osteoarthritis of right hip   Past Medical History:  Diagnosis Date  . Arthritis    right hip  . Cataract bilateral  . GERD (gastroesophageal reflux disease)   . Hypertension   . OSA (obstructive sleep apnea)    cpap used nightly  . Paroxysmal atrial fibrillation (HCC)    Dr. Talmadge Chad  . Peripheral neuropathy (HCC)    bilateral feet  . Raynaud's syndrome   . Raynaud's syndrome    fingers and toes    Surgeries: Procedure(s): RIGHT TOTAL HIP ARTHROPLASTY ANTERIOR APPROACH on 12/31/2015   Consultants (if any):   Discharged Condition: Improved  Hospital Course: Ebony Mason is an 75 y.o. female who was admitted 12/31/2015 with a diagnosis of Primary osteoarthritis of right hip and went to the operating room on 12/31/2015 and underwent the above named procedures.    She was given perioperative antibiotics:  Anti-infectives    Start     Dose/Rate Route Frequency Ordered Stop   12/31/15 2359  ceFAZolin (ANCEF) IVPB 2g/100 mL premix     2 g 200 mL/hr over 30 Minutes Intravenous Every 6 hours 12/31/15 2310 01/01/16 0638   12/31/15 1345  ceFAZolin (ANCEF) IVPB 2g/100 mL premix     2 g 200 mL/hr over 30 Minutes Intravenous On call to O.R. 12/31/15 1339 12/31/15 1751    .  She was given sequential compression devices, early ambulation, and coumadin with lovenox bridge for DVT prophylaxis.  She benefited maximally from the hospital stay and there were no complications.    Recent vital signs:  Vitals:   01/02/16 0845 01/02/16 1413  BP: (!) 116/46 (!) 130/47  Pulse: 69 69  Resp: 15 16  Temp: 98.4 F (36.9 C) 98.4 F (36.9 C)    Recent laboratory studies:  Lab Results  Component Value Date    HGB 8.1 (L) 01/02/2016   HGB 8.1 (L) 01/02/2016   HGB 10.3 (L) 01/01/2016   Lab Results  Component Value Date   WBC 13.8 (H) 01/02/2016   PLT 227 01/02/2016   Lab Results  Component Value Date   INR 1.53 01/02/2016   Lab Results  Component Value Date   NA 138 01/01/2016   K 3.9 01/01/2016   CL 106 01/01/2016   CO2 24 01/01/2016   BUN 18 01/01/2016   CREATININE 1.44 (H) 01/01/2016   GLUCOSE 242 (H) 01/01/2016    Discharge Medications:     Medication List    STOP taking these medications   diclofenac sodium 1 % Gel Commonly known as:  VOLTAREN   HYDROcodone-acetaminophen 5-325 MG tablet Commonly known as:  NORCO/VICODIN Replaced by:  HYDROcodone-acetaminophen 7.5-325 MG tablet     TAKE these medications   docusate sodium 100 MG capsule Commonly known as:  COLACE Take 1 capsule (100 mg total) by mouth 2 (two) times daily.   enoxaparin 40 MG/0.4ML injection Commonly known as:  LOVENOX Inject 0.4 mLs (40 mg total) into the skin daily.   ferrous sulfate 325 (65 FE) MG tablet Commonly known as:  FERROUSUL Take 1 tablet (325 mg total) by mouth 2 (two) times daily with a meal.   gabapentin 600 MG tablet Commonly known as:  NEURONTIN Take 600 mg by mouth 3 (three) times daily. Taking 3-4  times daily.   HYDROcodone-acetaminophen 7.5-325 MG tablet Commonly known as:  NORCO Take 1-2 tablets by mouth every 4 (four) hours as needed (breakthrough pain). Replaces:  HYDROcodone-acetaminophen 5-325 MG tablet   lovastatin 40 MG tablet Commonly known as:  MEVACOR TAKE ONE TABLET EVERY EVENING FOR CHOLESTEROL   methocarbamol 500 MG tablet Commonly known as:  ROBAXIN Take 1 tablet (500 mg total) by mouth every 6 (six) hours as needed for muscle spasms.   metoprolol succinate 100 MG 24 hr tablet Commonly known as:  TOPROL-XL Take 100 mg by mouth at bedtime.   omeprazole 20 MG capsule Commonly known as:  PRILOSEC Take 20 mg by mouth daily.   ondansetron 4 MG  tablet Commonly known as:  ZOFRAN Take 1 tablet (4 mg total) by mouth every 6 (six) hours as needed for nausea.   oxybutynin 5 MG 24 hr tablet Commonly known as:  DITROPAN-XL Take 5 mg by mouth daily.   polyethylene glycol packet Commonly known as:  MIRALAX / GLYCOLAX Take 17 g by mouth daily as needed for mild constipation.   senna 8.6 MG Tabs tablet Commonly known as:  SENOKOT Take 2 tablets (17.2 mg total) by mouth at bedtime.   triamterene-hydrochlorothiazide 37.5-25 MG tablet Commonly known as:  MAXZIDE-25 Take 0.5 tablets by mouth daily.   VITAMIN B-12 IJ Inject 1,000 mcg as directed every 30 (thirty) days. Last injection 11-27-15   warfarin 2.5 MG tablet Commonly known as:  COUMADIN Take 1.25-2.5 mg by mouth See admin instructions. Pt takes 1.25 Mon Wed Fri, and 2.5 Sun Tues Thurs Sat     Ferrous sulfate 325mg  Take twice daily with meals  Diagnostic Studies: Dg Pelvis Portable  Result Date: 12/31/2015 CLINICAL DATA:  Postoperative radiograph, status post right hip arthroplasty. Initial encounter. EXAM: PORTABLE PELVIS 1-2 VIEWS COMPARISON:  CT of the abdomen and pelvis performed 11/17/2007 FINDINGS: There is no evidence of fracture or dislocation. The patient's right hip arthroplasty is grossly unremarkable in appearance, without evidence of loosening or new fracture. No significant degenerative change is appreciated. The sacroiliac joints are unremarkable in appearance. The visualized bowel gas pattern is grossly unremarkable in appearance. IMPRESSION: No evidence of fracture or dislocation. Right hip arthroplasty is grossly unremarkable. Electronically Signed   By: Garald Balding M.D.   On: 12/31/2015 20:54   Dg C-arm 61-120 Min-no Report  Result Date: 12/31/2015 CLINICAL DATA: surgery C-ARM 61-120 MINUTES Fluoroscopy was utilized by the requesting physician.  No radiographic interpretation.   Dg Hip Operative Unilat With Pelvis Right  Result Date:  01/01/2016 CLINICAL DATA:  Right hip replacement. EXAM: OPERATIVE RIGHT HIP (WITH PELVIS IF PERFORMED) 1 VIEW TECHNIQUE: Fluoroscopic spot image(s) were submitted for interpretation post-operatively. COMPARISON:  No recent prior. FINDINGS: Total right hip replacement. Hardware intact. Anatomic alignment. One image obtained. 0 minutes 32.8 seconds fluoroscopy time. Radiation dose 3 mGy. IMPRESSION: Total right hip replacement.  Anatomic alignment. Electronically Signed   By: Marcello Moores  Register   On: 01/01/2016 07:35    Disposition: 01-Home or Self Care  Discharge Instructions    Call MD / Call 911    Complete by:  As directed    If you experience chest pain or shortness of breath, CALL 911 and be transported to the hospital emergency room.  If you develope a fever above 101 F, pus (white drainage) or increased drainage or redness at the wound, or calf pain, call your surgeon's office.   Call MD / Call 911    Complete  by:  As directed    If you experience chest pain or shortness of breath, CALL 911 and be transported to the hospital emergency room.  If you develope a fever above 101 F, pus (white drainage) or increased drainage or redness at the wound, or calf pain, call your surgeon's office.   Call MD / Call 911    Complete by:  As directed    If you experience chest pain or shortness of breath, CALL 911 and be transported to the hospital emergency room.  If you develope a fever above 101 F, pus (white drainage) or increased drainage or redness at the wound, or calf pain, call your surgeon's office.   Constipation Prevention    Complete by:  As directed    Drink plenty of fluids.  Prune juice may be helpful.  You may use a stool softener, such as Colace (over the counter) 100 mg twice a day.  Use MiraLax (over the counter) for constipation as needed.   Constipation Prevention    Complete by:  As directed    Drink plenty of fluids.  Prune juice may be helpful.  You may use a stool softener, such as  Colace (over the counter) 100 mg twice a day.  Use MiraLax (over the counter) for constipation as needed.   Diet - low sodium heart healthy    Complete by:  As directed    Diet - low sodium heart healthy    Complete by:  As directed    Discharge instructions    Complete by:  As directed    Discontinue lovenox injection when INR > 1.9. Have INR checked by your PCP on Monday 10/23   Driving restrictions    Complete by:  As directed    No driving for 6 weeks   Increase activity slowly as tolerated    Complete by:  As directed    Increase activity slowly as tolerated    Complete by:  As directed    Lifting restrictions    Complete by:  As directed    No lifting for 6 weeks   TED hose    Complete by:  As directed    Use stockings (TED hose) for 2 weeks on both leg(s).  You may remove them at night for sleeping.      Follow-up Information    Ivori Storr, Horald Pollen, MD. Schedule an appointment as soon as possible for a visit in 2 week(s).   Specialty:  Orthopedic Surgery Why:  For wound re-check Contact information: [address] [place] [address] [phone]        [employer] .   Why:  Physical therapy  Contact information: [phone]           Signed: Elie Goody 01/03/2016, 10:14 AM

## 2016-01-01 NOTE — Progress Notes (Signed)
Discharge planning, spoke with patient and spouse at beside. Chartered certified accountant for [employer], contacted [employer] @ [phone] for referral. Faxed information to [facility] at [phone]. Has RW, declines 3-n-1. [phone]

## 2016-01-02 LAB — CBC
HCT: 25.4 % — ABNORMAL LOW (ref 36.0–46.0)
HEMOGLOBIN: 8.1 g/dL — AB (ref 12.0–15.0)
MCH: 29.3 pg (ref 26.0–34.0)
MCHC: 31.9 g/dL (ref 30.0–36.0)
MCV: 92 fL (ref 78.0–100.0)
PLATELETS: 227 10*3/uL (ref 150–400)
RBC: 2.76 MIL/uL — ABNORMAL LOW (ref 3.87–5.11)
RDW: 15.3 % (ref 11.5–15.5)
WBC: 13.8 10*3/uL — ABNORMAL HIGH (ref 4.0–10.5)

## 2016-01-02 LAB — PROTIME-INR
INR: 1.53
PROTHROMBIN TIME: 18.5 s — AB (ref 11.4–15.2)

## 2016-01-02 LAB — HEMOGLOBIN AND HEMATOCRIT, BLOOD
HCT: 25.1 % — ABNORMAL LOW (ref 36.0–46.0)
Hemoglobin: 8.1 g/dL — ABNORMAL LOW (ref 12.0–15.0)

## 2016-01-02 MED ORDER — FERROUS SULFATE 325 (65 FE) MG PO TABS: 325.0000 mg | ORAL_TABLET | Freq: Two times a day (BID) | ORAL | 3 refills | Status: DC

## 2016-01-02 MED ORDER — WARFARIN SODIUM 2 MG PO TABS
2.0000 mg | ORAL_TABLET | Freq: Once | ORAL | Status: DC
  Filled 2016-01-02: qty 1

## 2016-01-02 NOTE — Progress Notes (Signed)
Physical Therapy Treatment Patient Details Name: Ebony Mason MRN: [id number] DOB: [birth year] Today's Date: 01/02/2016    History of Present Illness Pt s/p R THR and with hx of peripheral neuropathy    PT Comments    Pt with marked improvement in all mobility tasks.  Reviewed therex, transfers and stairs.  Follow Up Recommendations  Home health PT     Equipment Recommendations  None recommended by PT    Recommendations for Other Services OT consult     Precautions / Restrictions Precautions Precautions: Fall Restrictions Weight Bearing Restrictions: No RLE Weight Bearing: Weight bearing as tolerated    Mobility  Bed Mobility Overal bed mobility: Needs Assistance Bed Mobility: Supine to Sit     Supine to sit: Min assist;Mod assist     General bed mobility comments: Cues for sequence and use of L LE to self assist.  Physical assist to manage R LE and to bring trunk to upright  Transfers Overall transfer level: Needs assistance Equipment used: None Transfers: Sit to/from Stand Sit to Stand: Min guard         General transfer comment: cues for LE management and use of UEs to self assist  Ambulation/Gait Ambulation/Gait assistance: Min guard;Supervision Ambulation Distance (Feet): 75 Feet Assistive device: Rolling walker (2 wheeled) Gait Pattern/deviations: Step-to pattern;Shuffle;Trunk flexed Gait velocity: decr Gait velocity interpretation: Below normal speed for age/gender General Gait Details: Increased time and cues for sequence, posture and position from RW   Stairs Stairs: Yes Stairs assistance: Min assist Stair Management: No rails;Step to pattern;Backwards;With walker Number of Stairs: 2 General stair comments: single step twice bkwd with cues for sequence and foot placement - to practice for stool into high bed  Wheelchair Mobility    Modified Rankin (Stroke Patients Only)       Balance                                     Cognition Arousal/Alertness: Awake/alert Behavior During Therapy: WFL for tasks assessed/performed Overall Cognitive Status: Within Functional Limits for tasks assessed                      Exercises Total Joint Exercises Ankle Circles/Pumps: AROM;Both;15 reps;Supine Quad Sets: AROM;Both;10 reps;Supine Heel Slides: AAROM;Right;Supine;20 reps Hip ABduction/ADduction: AAROM;Right;Supine;15 reps    General Comments        Pertinent Vitals/Pain Pain Assessment: 0-10 Pain Score: 4  Pain Location: R hip Pain Descriptors / Indicators: Aching;Sore Pain Intervention(s): Limited activity within patient's tolerance;Monitored during session;Premedicated before session;Ice applied    Home Living                      Prior Function            PT Goals (current goals can now be found in the care plan section) Acute Rehab PT Goals Patient Stated Goal: Regain IND and resume previous lifestyle PT Goal Formulation: With patient Time For Goal Achievement: 01/06/16 Potential to Achieve Goals: Good Progress towards PT goals: Progressing toward goals    Frequency    7X/week      PT Plan Current plan remains appropriate    Co-evaluation             End of Session Equipment Utilized During Treatment: Gait belt Activity Tolerance: Patient tolerated treatment well;Patient limited by fatigue Patient left: in chair;with call bell/phone within reach;with family/visitor present  Time: 1010-1050 PT Time Calculation (min) (ACUTE ONLY): 40 min  Charges:  $Gait Training: 8-22 mins $Therapeutic Exercise: 8-22 mins $Therapeutic Activity: 8-22 mins                    G Codes:      Arcelia Pals 2016-01-28, 12:47 PM

## 2016-01-02 NOTE — Progress Notes (Signed)
ANTICOAGULATION CONSULT NOTE - Follow Up Consult  Pharmacy Consult for warfarin Indication: atrial fibrillation and VTE prophylaxis (s/p R THA)  No Known Allergies  Patient Measurements: Height: 5\' 6"  (167.6 cm) Weight: 213 lb (96.6 kg) IBW/kg (Calculated) : 59.3   Vital Signs: Temp: 98.4 F (36.9 C) (10/21 0845) Temp Source: Oral (10/21 0845) BP: 116/46 (10/21 0845) Pulse Rate: 73 (10/21 0558)  Labs:  Recent Labs  12/31/15 1408  01/01/16 0104 01/02/16 0535 01/02/16 1010  HGB  --   < > 10.3* 8.1* 8.1*  HCT  --   --  32.6* 25.4* 25.1*  PLT  --   --  261 227  --   LABPROT 15.2  --  15.4* 18.5*  --   INR 1.19  --  1.22 1.53  --   CREATININE  --   --  1.44*  --   --   < > = values in this interval not displayed.  Estimated Creatinine Clearance: 38.3 mL/min (by C-G formula based on SCr of 1.44 mg/dL (H)).   Medications:  Home warfarin regimen: 2.5 mg daily except 1.25 mg on MWF (last dose 10/13 PTA)  Assessment: Patient is a 75 y.o F with his Afib on warfarin PTA, presented to [facility] on 10/19 for R THA.  Warfarin resumed post-op.   Today, 01/02/2016: - INR up to 1.53 with 2 mg dose  - Hgb down to 8.1. Repeat level was 8.1 as well.  - no bleeding documented - no significant drug-drug intxns   Goal of Therapy:  INR 2-3 Monitor platelets by anticoagulation protocol: Yes   Plan:  - warfarin 2 mg PO x1 today - continue lovenox 40 mg SQ q24h (Discontinue when INR > 1.8) - monitor for s/s bleeding  Royetta Asal, PharmD, BCPS Pager [phone] 01/02/2016 10:46 AM

## 2016-01-02 NOTE — Care Management Note (Signed)
Case Management Note  Patient Details  Name: Ebony Mason MRN: [id number] Date of Birth: Jul 23, 1940  Subjective/Objective:    Right THA                Action/Plan: Discharge Planning: AVS reviewed:   Please see previous NCM notes. Pt has RW. Faxed dc summary, facesheet, and orders to [facility].    Expected Discharge Date:  01/02/2016              Expected Discharge Plan:  [place]  In-House Referral:  NA  Discharge planning Services  CM Consult  Post Acute Care Choice:  Home Health Choice offered to:  Patient  DME Arranged:  N/A DME Agency:  NA  HH Arranged:  PT HH Agency:  [place]  Status of Service:  Completed, signed off  If discussed at [place] of Stay Meetings, dates discussed:    Additional Comments:  Erenest Rasher, RN 01/02/2016, 2:56 PM

## 2016-01-02 NOTE — Progress Notes (Signed)
Discharged from floor via w/c for transport home by car. Belongings & spouse with pt. No changes in assessment. Amazing Cowman  

## 2016-01-02 NOTE — Progress Notes (Signed)
Subjective: 2 Days Post-Op Procedure(s) (LRB): RIGHT TOTAL HIP ARTHROPLASTY ANTERIOR APPROACH (Right) Patient reports pain as 2 on 0-10 scale.  HBg 8.1. Will repeat HBg stat and have them call  us with result.  Objective: Vital signs in last 24 hours: Temp:  [97.9 F (36.6 C)-98.6 F (37 C)] 98.4 F (36.9 C) (10/21 0845) Pulse Rate:  [64-73] 73 (10/21 0558) Resp:  [14-16] 16 (10/21 0558) BP: (98-116)/(41-63) 116/46 (10/21 0845) SpO2:  [93 %-97 %] 95 % (10/21 0558)  Intake/Output from previous day: 10/20 0701 - 10/21 0700 In: 3171.7 [P.O.:640; I.V.:2531.7] Out: 750 [Urine:750] Intake/Output this shift: No intake/output data recorded.   Recent Labs  01/01/16 0104 01/02/16 0535  HGB 10.3* 8.1*    Recent Labs  01/01/16 0104 01/02/16 0535  WBC 12.9* 13.8*  RBC 3.56* 2.76*  HCT 32.6* 25.4*  PLT 261 227    Recent Labs  01/01/16 0104  NA 138  K 3.9  CL 106  CO2 24  BUN 18  CREATININE 1.44*  GLUCOSE 242*  CALCIUM 8.6*    Recent Labs  01/01/16 0104 01/02/16 0535  INR 1.22 1.53    Dorsiflexion/Plantar flexion intact No cellulitis present  Assessment/Plan: 2 Days Post-Op Procedure(s) (LRB): RIGHT TOTAL HIP ARTHROPLASTY ANTERIOR APPROACH (Right) Up with therapy Discharge home with home health,providing her HBg is stable. Will repeat HBg. And if stable will DC on Iron pills.  Ebony Mason A 01/02/2016, 9:51 AM

## 2016-01-02 NOTE — Progress Notes (Signed)
Occupational Therapy Evaluation Patient Details Name: Ebony Mason MRN: [id number] DOB: November 14, 1940 Today's Date: 01/02/2016    History of Present Illness Pt s/p R THR and with hx of peripheral neuropathy   Clinical Impression   All OT education completed and pt questions answered. No further OT needs identified at this time. Will sign off.    Follow Up Recommendations  No OT follow up;Supervision/Assistance - 24 hour    Equipment Recommendations  None recommended by OT    Recommendations for Other Services       Precautions / Restrictions Precautions Precautions: Fall Restrictions Weight Bearing Restrictions: No RLE Weight Bearing: Weight bearing as tolerated      Mobility Bed Mobility            General bed mobility comments: NT -- OOB in recliner upon arrival  Transfers Overall transfer level: Needs assistance Equipment used: Rolling walker (2 wheeled) Transfers: Sit to/from Stand Sit to Stand: Min guard         General transfer comment: cues for LE management and use of UEs to self assist    Balance                                            ADL Overall ADL's : Needs assistance/impaired Eating/Feeding: Independent;Sitting   Grooming: Wash/dry hands;Supervision/safety;Min guard;Standing           Upper Body Dressing : Set up;Sitting   Lower Body Dressing: Minimal assistance;Moderate assistance;Sit to/from stand   Toilet Transfer: Min guard;Ambulation;BSC;RW   Toileting- Water quality scientist and Hygiene: Min guard;Sit to/from stand   Tub/ Banker: Tub Product manager Details (indicate cue type and reason): Patient has been using tub transfer bench PTA so is familiar with technique Functional mobility during ADLs: Min guard;Rolling walker General ADL Comments: Pt's husband has been assisting her with LB dressing PTA and can assist at discharge. Patient practiced toileting/grooming during  session.     Vision     Perception     Praxis      Pertinent Vitals/Pain Pain Assessment: 0-10 Pain Score: 5  Pain Location: R hip Pain Descriptors / Indicators: Aching;Sore Pain Intervention(s): Limited activity within patient's tolerance;Monitored during session;Repositioned;Ice applied     Hand Dominance     Extremity/Trunk Assessment Upper Extremity Assessment Upper Extremity Assessment: Overall WFL for tasks assessed   Lower Extremity Assessment Lower Extremity Assessment: Defer to PT evaluation       Communication Communication Communication: No difficulties   Cognition Arousal/Alertness: Awake/alert Behavior During Therapy: WFL for tasks assessed/performed Overall Cognitive Status: Within Functional Limits for tasks assessed                     General Comments       Exercises      Shoulder Instructions      Home Living Family/patient expects to be discharged to:: Private residence Living Arrangements: Spouse/significant other Available Help at Discharge: Family Type of Home: House Home Access: Ramped entrance     Home Layout: One level     Bathroom Shower/Tub: Teacher, [place] Ebony Mason years/pre: Handicapped height Bathroom Accessibility: Yes How Accessible: Accessible via walker Home Equipment: Ebony Mason - 2 wheels;Walker - 4 wheels;Tub bench          Prior Functioning/Environment Level of Independence: Independent;Independent with assistive device(s)  OT Problem List: Decreased strength;Decreased range of motion;Pain   OT Treatment/Interventions:      OT Goals(Current goals can be found in the care plan section) Acute Rehab OT Goals Patient Stated Goal: Regain IND and resume previous lifestyle OT Goal Formulation: All assessment and education complete, DC therapy  OT Frequency:     Barriers to D/C:            Co-evaluation              End of Session Equipment Utilized During Treatment:  Rolling walker Nurse Communication: Mobility status  Activity Tolerance: Patient tolerated treatment well Patient left: in chair;with call bell/phone within reach;with chair alarm set;with family/visitor present   Time: [id number] OT Time Calculation (min): 17 min Charges:  OT General Charges $OT Visit: 1 Procedure OT Evaluation $OT Eval Low Complexity: 1 Procedure G-Codes:    Ebony Mason 30-Jan-2016, 1:03 PM

## 2016-01-27 ENCOUNTER — Ambulatory Visit: Payer: Medicare Other | Admitting: Neurology

## 2017-01-08 IMAGING — RF DG HIP (WITH PELVIS) OPERATIVE*R*
1 series · 2 of 2 positions shown · non-contrast
Comparison: No recent prior.

CLINICAL DATA: Right hip replacement.

EXAM:
OPERATIVE RIGHT HIP (WITH PELVIS IF PERFORMED) 1 VIEW
TECHNIQUE: Fluoroscopic spot image(s) were submitted for interpretation
post-operatively.

[Series 1: run · 2 of 2 slices shown]
[im 1/2]
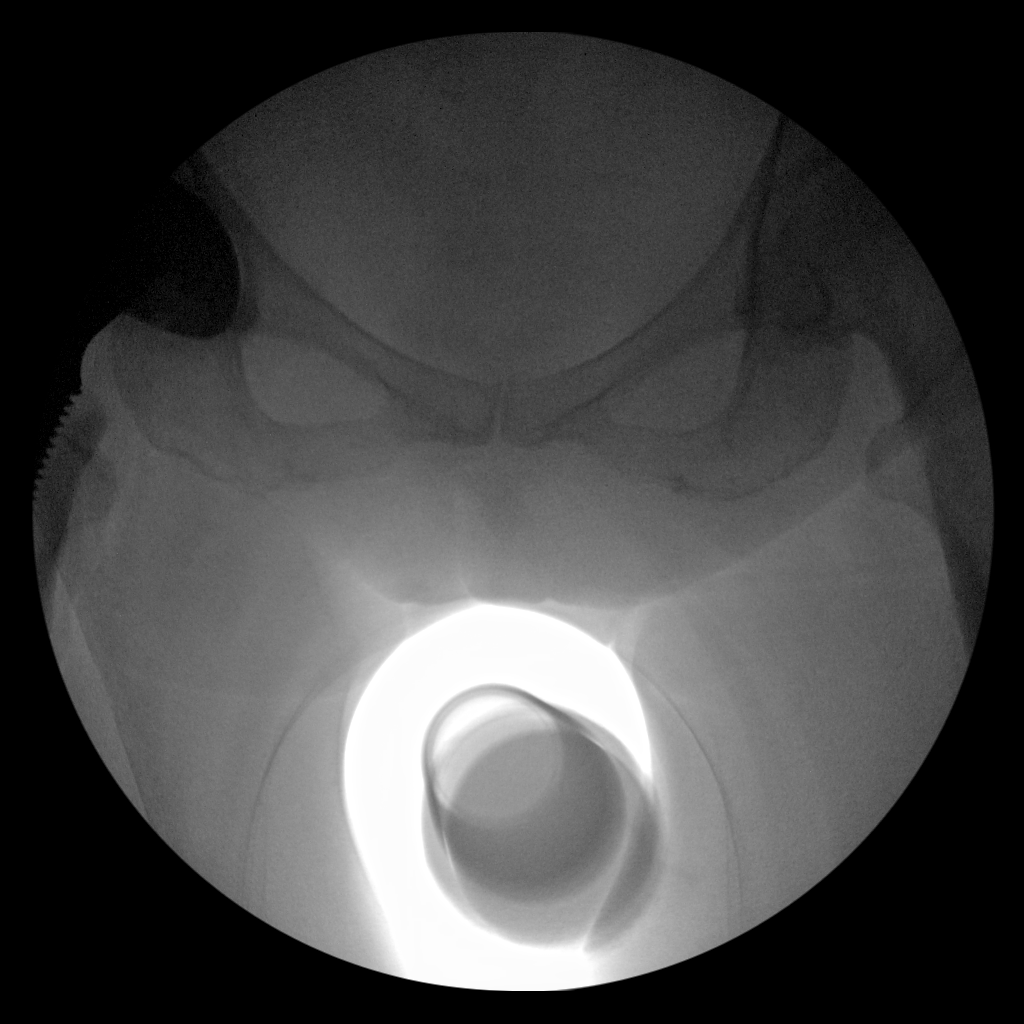
[im 2/2]
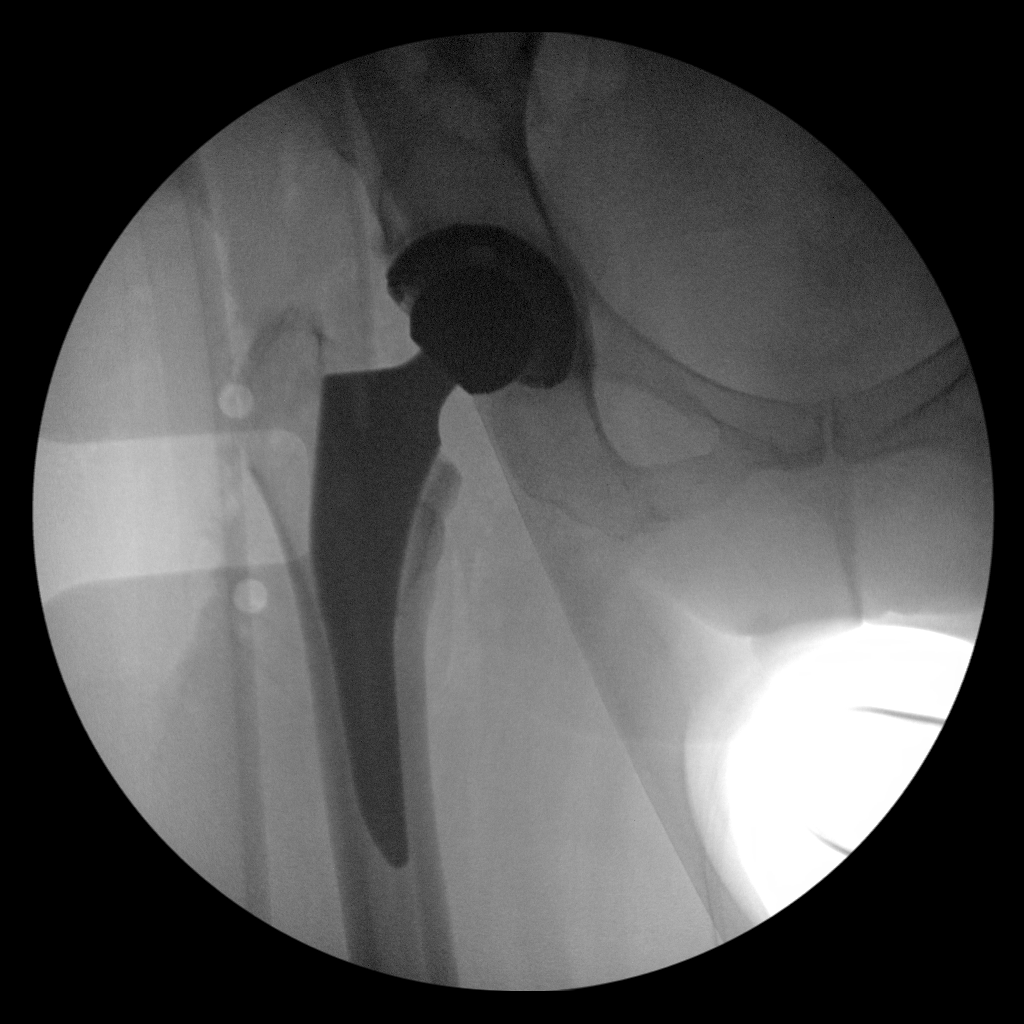

[2 of 2 positions shown; findings below may reference images not displayed]

FINDINGS: Total right hip replacement. Hardware intact. Anatomic alignment.
One image obtained. 0 minutes 32.8 seconds fluoroscopy time.
Radiation dose 3 mGy.
IMPRESSION: Total right hip replacement.  Anatomic alignment.

## 2021-10-27 ENCOUNTER — Ambulatory Visit: Payer: Self-pay | Admitting: Student

## 2021-11-10 NOTE — Patient Instructions (Addendum)
DUE TO SPACE LIMITATIONS, ONLY TWO VISITORS  (aged 83 and older) ARE ALLOWED TO COME WITH YOU AND STAY IN THE WAITING ROOM DURING YOUR PRE OP AND PROCEDURE.   **NO VISITORS ARE ALLOWED IN THE SHORT STAY AREA OR RECOVERY ROOM!!**  IF YOU WILL BE ADMITTED INTO THE HOSPITAL YOU ARE ALLOWED ONLY FOUR SUPPORT PEOPLE DURING VISITATION HOURS (7 AM -8PM)   The support person(s) must pass our screening, and use Hand sanitizing gel. Visitors GUEST BADGE MUST BE WORN VISIBLY  One adult visitor may remain with you overnight and MUST be in the room by 8 P.M.   You are not required to quarantine at this time prior to your surgery. However, you must do this: Hand Hygiene often Do NOT share personal items Notify your provider if you are in close contact with someone who has COVID or you develop fever 100.4 or greater, new onset of sneezing, cough, sore throat, shortness of breath or body aches.       Your procedure is scheduled on:  Thursday November 25, 2021  Report to [facility] Main Entrance.  Report to admitting at: 08:00    AM  +++++Call this number if you have any questions or problems the morning of surgery [phone]  Do not eat food :After Midnight the night prior to your surgery/procedure.  After Midnight you may have the following liquids until    07:30   AM  DAY OF SURGERY  Clear Liquid Diet Water Black Coffee (sugar ok, NO MILK/CREAM OR CREAMERS)  Tea (sugar ok, NO MILK/CREAM OR CREAMERS) regular and decaf                             Plain Jell-O (NO RED)                                           Fruit ices (not with fruit pulp, NO RED)                                     Popsicles (NO RED)                                                                  Juice: apple, WHITE grape, WHITE cranberry Sports drinks like Gatorade (NO RED)                   The day of surgery:  Drink ONE (1) Pre-Surgery G2 at  07:30  AM the morning of surgery. Drink in one sitting. Do not  sip.  This drink was given to you during your hospital pre-op appointment visit. Nothing else to drink after completing the Pre-Surgery G2  FOLLOW ANY ADDITIONAL PRE OP INSTRUCTIONS YOU RECEIVED FROM YOUR SURGEON'S OFFICE!!!   Oral Hygiene is also important to reduce your risk of infection.        Remember - BRUSH YOUR TEETH THE MORNING OF SURGERY WITH YOUR REGULAR TOOTHPASTE  HOLD COUMADIN x 5 days, follow instructions from Dr. Trilby Drummer on any Lovenox  injections   Take ONLY these medicines the morning of surgery with A SIP OF WATER: Gabapentin, febuxostat (uloric), oxybutynin (Ditropan), pantoprazole (Protonix)  If needed, Tramadol, and use your Flonase spray.                    You may not have any metal on your body including hair pins, jewelry, and body piercing  Do not wear make-up, lotions, powders, perfumes or deodorant  Do not wear nail polish including gel and S&S, artificial / acrylic nails, or any other type of covering on natural nails including finger and toenails. If you have artificial nails, gel coating, etc., that needs to be removed by a nail salon, Please have this removed prior to surgery. Not doing so may mean that your surgery could be cancelled or delayed if the Surgeon or anesthesia staff feels like they are unable to monitor you safely.   Do not shave 48 hours prior to surgery to avoid nicks in your skin which may contribute to postoperative infections.   Contacts, Hearing Aids, dentures or bridgework may not be worn into surgery.   You may bring a small overnight bag with you on the day of surgery, only pack items that are not valuable .[facility] IS NOT RESPONSIBLE   FOR VALUABLES THAT ARE LOST OR STOLEN.   DO NOT [place]. PHARMACY WILL DISPENSE MEDICATIONS LISTED ON YOUR MEDICATION LIST TO YOU DURING YOUR ADMISSION [place]!    Please read over the following fact sheets you were given: IF YOU HAVE QUESTIONS ABOUT YOUR  PRE-OP INSTRUCTIONS, PLEASE CALL [phone]  ([place])   [facility] - Preparing for Surgery Before surgery, you can play an important role.  Because skin is not sterile, your skin needs to be as free of germs as possible.  You can reduce the number of germs on your skin by washing with CHG (chlorahexidine gluconate) soap before surgery.  CHG is an antiseptic cleaner which kills germs and bonds with the skin to continue killing germs even after washing. Please DO NOT use if you have an allergy to CHG or antibacterial soaps.  If your skin becomes reddened/irritated stop using the CHG and inform your nurse when you arrive at Short Stay. Do not shave (including legs and underarms) for at least 48 hours prior to the first CHG shower.  You may shave your face/neck.  Please follow these instructions carefully:  1.  Shower with CHG Soap the night before surgery and the  morning of surgery.  2.  If you choose to wash your hair, wash your hair first as usual with your normal  shampoo.  3.  After you shampoo, rinse your hair and body thoroughly to remove the shampoo.                             4.  Use CHG as you would any other liquid soap.  You can apply chg directly to the skin and wash.  Gently with a scrungie or clean washcloth.  5.  Apply the CHG Soap to your body ONLY FROM THE NECK DOWN.   Do not use on face/ open                           Wound or open sores. Avoid contact with eyes, ears mouth and genitals (private parts).  Wash face,  Genitals (private parts) with your normal soap.             6.  Wash thoroughly, paying special attention to the area where your  surgery  will be performed.  7.  Thoroughly rinse your body with warm water from the neck down.  8.  DO NOT shower/wash with your normal soap after using and rinsing off the CHG Soap.            9.  Pat yourself dry with a clean towel.            10.  Wear clean pajamas.            11.  Place clean sheets on your bed  the night of your first shower and do not  sleep with pets.  ON THE DAY OF SURGERY : Do not apply any lotions/deodorants the morning of surgery.  Please wear clean clothes to the hospital/surgery center.    FAILURE TO FOLLOW THESE INSTRUCTIONS MAY RESULT IN THE CANCELLATION OF YOUR SURGERY  PATIENT SIGNATURE_________________________________  NURSE SIGNATURE__________________________________  ________________________________________________________________________     [name]    An incentive spirometer is a tool that can help keep your lungs clear and active. This tool measures how well you are filling your lungs with each breath. Taking long deep breaths may help reverse or decrease the chance of developing breathing (pulmonary) problems (especially infection) following: A long period of time when you are unable to move or be active. BEFORE THE PROCEDURE  If the spirometer includes an indicator to show your best effort, your nurse or respiratory therapist will set it to a desired goal. If possible, sit up straight or lean slightly forward. Try not to slouch. Hold the incentive spirometer in an upright position. INSTRUCTIONS FOR USE  Sit on the edge of your bed if possible, or sit up as far as you can in bed or on a chair. Hold the incentive spirometer in an upright position. Breathe out normally. Place the mouthpiece in your mouth and seal your lips tightly around it. Breathe in slowly and as deeply as possible, raising the piston or the ball toward the top of the column. Hold your breath for 3-5 seconds or for as long as possible. Allow the piston or ball to fall to the bottom of the column. Remove the mouthpiece from your mouth and breathe out normally. Rest for a few seconds and repeat Steps 1 through 7 at least 10 times every 1-2 hours when you are awake. Take your time and take a few normal breaths between deep breaths. The spirometer may include an indicator to  show your best effort. Use the indicator as a goal to work toward during each repetition. After each set of 10 deep breaths, practice coughing to be sure your lungs are clear. If you have an incision (the cut made at the time of surgery), support your incision when coughing by placing a pillow or rolled up towels firmly against it. Once you are able to get out of bed, walk around indoors and cough well. You may stop using the incentive spirometer when instructed by your caregiver.  RISKS AND COMPLICATIONS Take your time so you do not get dizzy or light-headed. If you are in pain, you may need to take or ask for pain medication before doing incentive spirometry. It is harder to take a deep breath if you are having pain. AFTER USE Rest and breathe slowly and easily. It  can be helpful to keep track of a log of your progress. Your caregiver can provide you with a simple table to help with this. If you are using the spirometer at home, follow these instructions: [place] IF:  You are having difficultly using the spirometer. You have trouble using the spirometer as often as instructed. Your pain medication is not giving enough relief while using the spirometer. You develop fever of 100.5 F (38.1 C) or higher.                                                                                                    SEEK IMMEDIATE MEDICAL CARE IF:  You cough up bloody sputum that had not been present before. You develop fever of 102 F (38.9 C) or greater. You develop worsening pain at or near the incision site. MAKE SURE YOU:  Understand these instructions. Will watch your condition. Will get help right away if you are not doing well or get worse. Document Released: 07/11/2006 Document Revised: 05/23/2011 Document Reviewed: 09/11/2006 [facility] Patient Information 2014 [place], [place].

## 2021-11-10 NOTE — Progress Notes (Addendum)
COVID Vaccine received:  '[]'$  No '[x]'$  Yes Date of any COVID positive Test in last 90 days: None  PCP - Dr. Maxcine Ham MD [address] [place] [address]   [phone] (Work)   [phone] (Fax)      Cardiologist - Dr. Tor Netters  [facility] CARDIOLOGY PRIMARY CARE [facility]   [address]   [place], [place] [address]   [phone]    Chest x-ray - 10-25-2020 [facility] EKG -  10-07-21   [facility]  ( on chart) Stress Test - 11-06-20 [facility] ECHO - 10-30-20 [facility] Cardiac Cath - n/a  Pacemaker/ICD device     '[x]'$  N/A Spinal Cord Stimulator:'[x]'$  No '[]'$  Yes      Other Implants:   History of Sleep Apnea? '[]'$  No '[x]'$  Yes   Sleep Study Date:   CPAP used?- '[]'$  No '[x]'$  Yes  (Instruct to bring their mask & Tubing)  Does the patient monitor blood sugar? '[x]'$  No '[]'$  Yes  '[]'$  N/A Patient watches her diet, no medications Fasting Blood Sugar Ranges-  Checks Blood Sugar ___0__ times a day  Blood Thinner Instructions:  Coumadin, (10-27-21  INR 3.1)  holding Coumadin for 5 days (Last day:11-20-21)   Next Coumadin Clinic appt- 11-19-2021   They will decide then on whether or not she gets a Lovenox bridge.   [facility] PRIMARY CARE AT [facility]   [place], [place] [address]   [phone]   Fax  [phone]   ERAS Protocol Ordered: '[]'$  No  '[x]'$  Yes PRE-SURGERY '[]'$  ENSURE  '[x]'$  G2   Activity level: Patient can not climb a flight of stairs without difficulty; '[x]'$  No CP   but would have _SOB and leg pain  Anesthesia review: OSA (CPAP), A.fib, Pre-DM, HTN,   Patient denies shortness of breath, fever, cough and chest pain at PAT appointment.  Patient verbalized understanding and agreement to the Pre-Surgical Instructions that were given to them at this PAT appointment. Patient was also educated of the need to review these PAT instructions again prior to his/her surgery.I reviewed the appropriate phone numbers to call if they have any and questions or  concerns.

## 2021-11-12 ENCOUNTER — Encounter (HOSPITAL_COMMUNITY)
Admission: RE | Admit: 2021-11-12 | Discharge: 2021-11-12 | Disposition: A | Discharge status: Home / Self Care | Payer: Medicare HMO | Source: Ambulatory Visit | Attending: Orthopedic Surgery | Admitting: Orthopedic Surgery

## 2021-11-12 ENCOUNTER — Encounter (HOSPITAL_COMMUNITY): Payer: Self-pay

## 2021-11-12 ENCOUNTER — Other Ambulatory Visit: Payer: Self-pay

## 2021-11-12 ENCOUNTER — Ambulatory Visit: Payer: Self-pay | Admitting: Student

## 2021-11-12 VITALS — BP 141/82 | HR 62 | Temp 98.6°F | Resp 16 | Ht 66.0 in | Wt 177.0 lb

## 2021-11-12 DIAGNOSIS — Z01818 Encounter for other preprocedural examination: Secondary | ICD-10-CM | POA: Insufficient documentation

## 2021-11-12 DIAGNOSIS — I10 Essential (primary) hypertension: Secondary | ICD-10-CM

## 2021-11-12 DIAGNOSIS — Z7901 Long term (current) use of anticoagulants: Secondary | ICD-10-CM

## 2021-11-12 DIAGNOSIS — R7303 Prediabetes: Secondary | ICD-10-CM

## 2021-11-12 HISTORY — DX: Malignant (primary) neoplasm, unspecified: C80.1

## 2021-11-12 HISTORY — DX: Chronic kidney disease, unspecified: N18.9

## 2021-11-12 LAB — CBC
HCT: 41.8 % (ref 36.0–46.0)
Hemoglobin: 13 g/dL (ref 12.0–15.0)
MCH: 28.9 pg (ref 26.0–34.0)
MCHC: 31.1 g/dL (ref 30.0–36.0)
MCV: 92.9 fL (ref 80.0–100.0)
Platelets: 326 10*3/uL (ref 150–400)
RBC: 4.5 MIL/uL (ref 3.87–5.11)
RDW: 13.6 % (ref 11.5–15.5)
WBC: 8.9 10*3/uL (ref 4.0–10.5)
nRBC: 0 % (ref 0.0–0.2)

## 2021-11-12 LAB — HEMOGLOBIN A1C
Hgb A1c MFr Bld: 5.8 % — ABNORMAL HIGH (ref 4.8–5.6)
Mean Plasma Glucose: 119.76 mg/dL

## 2021-11-12 LAB — BASIC METABOLIC PANEL
Anion gap: 6 (ref 5–15)
BUN: 22 mg/dL (ref 8–23)
CO2: 29 mmol/L (ref 22–32)
Calcium: 9.6 mg/dL (ref 8.9–10.3)
Chloride: 105 mmol/L (ref 98–111)
Creatinine, Ser: 1.29 mg/dL — ABNORMAL HIGH (ref 0.44–1.00)
GFR, Estimated: 41 mL/min — ABNORMAL LOW (ref >60)
Glucose, Bld: 93 mg/dL (ref 70–99)
Potassium: 4.3 mmol/L (ref 3.5–5.1)
Sodium: 140 mmol/L (ref 135–145)

## 2021-11-12 LAB — TYPE AND SCREEN
ABO/RH(D): O POS
Antibody Screen: NEGATIVE

## 2021-11-12 LAB — GLUCOSE, CAPILLARY: Glucose-Capillary: 95 mg/dL (ref 70–99)

## 2021-11-12 LAB — SURGICAL PCR SCREEN
MRSA, PCR: NEGATIVE
Staphylococcus aureus: NEGATIVE

## 2021-11-12 NOTE — H&P (View-Only) (Signed)
TOTAL HIP ADMISSION H&P  Patient is admitted for left total hip arthroplasty.  Subjective:  Chief Complaint: left hip pain  HPI: Ebony Mason, 81 y.o. female, has a history of pain and functional disability in the left hip(s) due to arthritis and patient has failed non-surgical conservative treatments for greater than 12 weeks to include NSAID's and/or analgesics, flexibility and strengthening excercises, supervised PT with diminished ADL's post treatment, use of assistive devices, and activity modification.  Onset of symptoms was gradual starting >10 years ago with rapidlly worsening course since that time.The patient noted no past surgery on the left hip(s).  Patient currently rates pain in the left hip at 10 out of 10 with activity. Patient has night pain, worsening of pain with activity and weight bearing, trendelenberg gait, pain that interfers with activities of daily living, and pain with passive range of motion. Patient has evidence of subchondral cysts, subchondral sclerosis, periarticular osteophytes, and joint space narrowing by imaging studies. This condition presents safety issues increasing the risk of fall.  There is no current active infection.  Patient Active Problem List   Diagnosis Date Noted   Primary osteoarthritis of right hip 12/31/2015   Prediabetes 07/27/2015   Hereditary and idiopathic peripheral neuropathy 04/23/2015   Abnormality of gait 03/17/2015   Paresthesia 09/08/2014   Midline low back pain without sciatica 09/08/2014   Peripheral neuropathy 02/15/2013   Chest pain 10/15/2012   Long term (current) use of anticoagulants 10/02/2012   Paroxysmal atrial fibrillation ([place]) 09/27/2012   Essential hypertension 09/27/2012   Hypertension    Past Medical History:  Diagnosis Date   Arthritis    right hip   Cancer ([place])    basal cell skin cancer   Cataract bilateral   Chronic kidney disease    GERD (gastroesophageal reflux disease)    Hypertension    OSA  (obstructive sleep apnea)    cpap used nightly   Paroxysmal atrial fibrillation ([facility])    Dr. Talmadge Chad   Peripheral neuropathy    bilateral feet   Raynaud's syndrome    Raynaud's syndrome    fingers and toes    Past Surgical History:  Procedure Laterality Date   ABDOMINAL HYSTERECTOMY     CATARACT EXTRACTION, BILATERAL     LOWER EXTREMITY VENOUS DOPPLER  11/18/2010   No evidence of thrombus or thrombophlebitis. No venous insufficiency was noted.   TOTAL HIP ARTHROPLASTY Right 12/31/2015   Procedure: RIGHT TOTAL HIP ARTHROPLASTY ANTERIOR APPROACH;  Surgeon: Rod Can, MD;  Location: [facility];  Service: Orthopedics;  Laterality: Right;  Needs RNFA    Current Outpatient Medications  Medication Sig Dispense Refill Last Dose   cholecalciferol (VITAMIN D3) 25 MCG (1000 UNIT) tablet Take 1,000 Units by mouth daily.      clobetasol cream (TEMOVATE) 3.47 % Apply 1 Application topically daily as needed for rash.      cyanocobalamin (VITAMIN B12) 1000 MCG/ML injection Inject 1,000 mcg into the skin every 30 (thirty) days.      diclofenac Sodium (VOLTAREN) 1 % GEL Apply 2 g topically daily as needed (pain).      docusate sodium (COLACE) 100 MG capsule Take 1 capsule (100 mg total) by mouth 2 (two) times daily. (Patient not taking: Reported on 11/09/2021) 10 capsule 0    enoxaparin (LOVENOX) 40 MG/0.4ML injection Inject 0.4 mLs (40 mg total) into the skin daily. (Patient not taking: Reported on 11/09/2021) 5 Syringe 0    febuxostat (ULORIC) 40 MG tablet Take 40 mg  by mouth daily.      ferrous sulfate (FERROUSUL) 325 (65 FE) MG tablet Take 1 tablet (325 mg total) by mouth 2 (two) times daily with a meal. (Patient not taking: Reported on 11/09/2021) 60 tablet 3    fluticasone (FLONASE) 50 MCG/ACT nasal spray Place 2 sprays into both nostrils daily as needed for allergies.      gabapentin (NEURONTIN) 600 MG tablet Take 600 mg by mouth in the morning, at noon, in the evening, and at bedtime.       HYDROcodone-acetaminophen (NORCO) 7.5-325 MG tablet Take 1-2 tablets by mouth every 4 (four) hours as needed (breakthrough pain). (Patient not taking: Reported on 11/09/2021) 60 tablet 0    lovastatin (MEVACOR) 40 MG tablet Take 40 mg by mouth daily.  3    methocarbamol (ROBAXIN) 500 MG tablet Take 1 tablet (500 mg total) by mouth every 6 (six) hours as needed for muscle spasms. (Patient not taking: Reported on 11/09/2021) 20 tablet 0    metoprolol succinate (TOPROL-XL) 100 MG 24 hr tablet Take 100 mg by mouth at bedtime.       NON FORMULARY Pt uses a cpap nightly      ondansetron (ZOFRAN) 4 MG tablet Take 1 tablet (4 mg total) by mouth every 6 (six) hours as needed for nausea. (Patient not taking: Reported on 11/09/2021) 20 tablet 0    oxybutynin (DITROPAN-XL) 5 MG 24 hr tablet Take 5 mg by mouth daily.      OXYGEN Inhale 2 L into the lungs at bedtime.      pantoprazole (PROTONIX) 40 MG tablet Take 40 mg by mouth daily.      polyethylene glycol (MIRALAX / GLYCOLAX) packet Take 17 g by mouth daily as needed for mild constipation. (Patient not taking: Reported on 11/09/2021) 14 each 0    senna (SENOKOT) 8.6 MG TABS tablet Take 2 tablets (17.2 mg total) by mouth at bedtime. (Patient not taking: Reported on 11/09/2021) 120 each 0    traMADol (ULTRAM) 50 MG tablet Take 50 mg by mouth every 6 (six) hours as needed for moderate pain.      triamterene-hydrochlorothiazide (MAXZIDE-25) 37.5-25 MG per tablet Take 1 tablet by mouth daily.      warfarin (COUMADIN) 3 MG tablet Take 3 mg by mouth daily.      No current facility-administered medications for this visit.   No Known Allergies  Social History   Tobacco Use   Smoking status: Never   Smokeless tobacco: Never  Substance Use Topics   Alcohol use: No    Alcohol/week: 0.0 standard drinks of alcohol    Family History  Problem Relation Age of Onset   Liver cancer Mother    Alzheimer's disease Father    Diabetes Maternal Grandfather      Review of  Systems  Musculoskeletal:  Positive for arthralgias, gait problem and myalgias.  All other systems reviewed and are negative.   Objective:  Physical Exam Constitutional:      Appearance: Normal appearance.  HENT:     Head: Normocephalic and atraumatic.     Nose: Nose normal.     Mouth/Throat:     Mouth: Mucous membranes are moist.     Pharynx: Oropharynx is clear.  Eyes:     Extraocular Movements: Extraocular movements intact.  Cardiovascular:     Rate and Rhythm: Normal rate and regular rhythm.     Pulses: Normal pulses.     Heart sounds: Normal heart sounds.  Pulmonary:  Effort: Pulmonary effort is normal.     Breath sounds: Normal breath sounds.  Abdominal:     General: Abdomen is flat.     Palpations: Abdomen is soft.  Genitourinary:    Comments: deferred Musculoskeletal:     Cervical back: Normal range of motion and neck supple.     Comments: Examination of the left hip reveals no skin wounds, lesions, rashes, or erythema. Pain with flexion and rotation of the hip. Trochanteric tenderness to palpation.   Sensory and motor function intact in LE bilaterally. Palpable pedal pulses.   Mild pedal edema at baseline with spider veins bilaterally. Calves soft and non-tender.   Skin:    General: Skin is warm and dry.     Capillary Refill: Capillary refill takes less than 2 seconds.  Neurological:     General: No focal deficit present.     Mental Status: She is alert and oriented to person, place, and time.  Psychiatric:        Mood and Affect: Mood normal.        Behavior: Behavior normal.        Thought Content: Thought content normal.        Judgment: Judgment normal.     Vital signs in last 24 hours: '@VSRANGES'$ @  Labs:   Estimated body mass index is 28.57 kg/m as calculated from the following:   Height as of an earlier encounter on 11/12/21: '5\' 6"'$  (1.676 m).   Weight as of an earlier encounter on 11/12/21: 80.3 kg.   Imaging Review Plain radiographs  demonstrate severe degenerative joint disease of the left hip(s). The bone quality appears to be adequate for age and reported activity level.      Assessment/Plan:  End stage arthritis, left hip(s)  The patient history, physical examination, clinical judgement of the provider and imaging studies are consistent with end stage degenerative joint disease of the left hip(s) and total hip arthroplasty is deemed medically necessary. The treatment options including medical management, injection therapy, arthroscopy and arthroplasty were discussed at length. The risks and benefits of total hip arthroplasty were presented and reviewed. The risks due to aseptic loosening, infection, stiffness, dislocation/subluxation,  thromboembolic complications and other imponderables were discussed.  The patient acknowledged the explanation, agreed to proceed with the plan and consent was signed. Patient is being admitted for inpatient treatment for surgery, pain control, PT, OT, prophylactic antibiotics, VTE prophylaxis, progressive ambulation and ADL's and discharge planning.The patient is planning to be discharged home after an overnight stay with [facility].   Therapy Plans: [facility].  Disposition: Home with husband Planned DVT Prophylaxis: Warfarin DME needed: walker PCP: Cleared Cardiology: Cleared. She is to stop warfarin 5 days prior to surgery.  TXA: IV Allergies:  - Augmentin - diarrhea Anesthesia Concerns: None BMI: 29 Last HgbA1c: Not diabetic Other: - Chronic a-fib - on warfarin - CKD - OSA, uses CPAP - Cr 1.20, Hgb 12.8.  - Hydrocodone, zofran.    Patient's anticipated LOS is less than 2 midnights, meeting these requirements: - Younger than 22 - Lives within 1 hour of care - Has a competent adult at home to recover with post-op recover - NO history of  - Chronic pain requiring opiods  - Diabetes  - Coronary Artery Disease  - Heart failure  - Heart attack  - Stroke  - DVT/VTE  - Cardiac  arrhythmia  - Respiratory Failure/COPD  - Renal failure  - Anemia  - Advanced Liver disease

## 2021-11-12 NOTE — H&P (Signed)
TOTAL HIP ADMISSION H&P  Patient is admitted for left total hip arthroplasty.  Subjective:  Chief Complaint: left hip pain  HPI: Ebony Mason, 81 y.o. female, has a history of pain and functional disability in the left hip(s) due to arthritis and patient has failed non-surgical conservative treatments for greater than 12 weeks to include NSAID's and/or analgesics, flexibility and strengthening excercises, supervised PT with diminished ADL's post treatment, use of assistive devices, and activity modification.  Onset of symptoms was gradual starting >10 years ago with rapidlly worsening course since that time.The patient noted no past surgery on the left hip(s).  Patient currently rates pain in the left hip at 10 out of 10 with activity. Patient has night pain, worsening of pain with activity and weight bearing, trendelenberg gait, pain that interfers with activities of daily living, and pain with passive range of motion. Patient has evidence of subchondral cysts, subchondral sclerosis, periarticular osteophytes, and joint space narrowing by imaging studies. This condition presents safety issues increasing the risk of fall.  There is no current active infection.  Patient Active Problem List   Diagnosis Date Noted   Primary osteoarthritis of right hip 12/31/2015   Prediabetes 07/27/2015   Hereditary and idiopathic peripheral neuropathy 04/23/2015   Abnormality of gait 03/17/2015   Paresthesia 09/08/2014   Midline low back pain without sciatica 09/08/2014   Peripheral neuropathy 02/15/2013   Chest pain 10/15/2012   Long term (current) use of anticoagulants 10/02/2012   Paroxysmal atrial fibrillation ([place]) 09/27/2012   Essential hypertension 09/27/2012   Hypertension    Past Medical History:  Diagnosis Date   Arthritis    right hip   Cancer ([place])    basal cell skin cancer   Cataract bilateral   Chronic kidney disease    GERD (gastroesophageal reflux disease)    Hypertension    OSA  (obstructive sleep apnea)    cpap used nightly   Paroxysmal atrial fibrillation ([facility])    Dr. Talmadge Chad   Peripheral neuropathy    bilateral feet   Raynaud's syndrome    Raynaud's syndrome    fingers and toes    Past Surgical History:  Procedure Laterality Date   ABDOMINAL HYSTERECTOMY     CATARACT EXTRACTION, BILATERAL     LOWER EXTREMITY VENOUS DOPPLER  11/18/2010   No evidence of thrombus or thrombophlebitis. No venous insufficiency was noted.   TOTAL HIP ARTHROPLASTY Right 12/31/2015   Procedure: RIGHT TOTAL HIP ARTHROPLASTY ANTERIOR APPROACH;  Surgeon: Rod Can, MD;  Location: [facility];  Service: Orthopedics;  Laterality: Right;  Needs RNFA    Current Outpatient Medications  Medication Sig Dispense Refill Last Dose   cholecalciferol (VITAMIN D3) 25 MCG (1000 UNIT) tablet Take 1,000 Units by mouth daily.      clobetasol cream (TEMOVATE) 8.85 % Apply 1 Application topically daily as needed for rash.      cyanocobalamin (VITAMIN B12) 1000 MCG/ML injection Inject 1,000 mcg into the skin every 30 (thirty) days.      diclofenac Sodium (VOLTAREN) 1 % GEL Apply 2 g topically daily as needed (pain).      docusate sodium (COLACE) 100 MG capsule Take 1 capsule (100 mg total) by mouth 2 (two) times daily. (Patient not taking: Reported on 11/09/2021) 10 capsule 0    enoxaparin (LOVENOX) 40 MG/0.4ML injection Inject 0.4 mLs (40 mg total) into the skin daily. (Patient not taking: Reported on 11/09/2021) 5 Syringe 0    febuxostat (ULORIC) 40 MG tablet Take 40 mg  by mouth daily.      ferrous sulfate (FERROUSUL) 325 (65 FE) MG tablet Take 1 tablet (325 mg total) by mouth 2 (two) times daily with a meal. (Patient not taking: Reported on 11/09/2021) 60 tablet 3    fluticasone (FLONASE) 50 MCG/ACT nasal spray Place 2 sprays into both nostrils daily as needed for allergies.      gabapentin (NEURONTIN) 600 MG tablet Take 600 mg by mouth in the morning, at noon, in the evening, and at bedtime.       HYDROcodone-acetaminophen (NORCO) 7.5-325 MG tablet Take 1-2 tablets by mouth every 4 (four) hours as needed (breakthrough pain). (Patient not taking: Reported on 11/09/2021) 60 tablet 0    lovastatin (MEVACOR) 40 MG tablet Take 40 mg by mouth daily.  3    methocarbamol (ROBAXIN) 500 MG tablet Take 1 tablet (500 mg total) by mouth every 6 (six) hours as needed for muscle spasms. (Patient not taking: Reported on 11/09/2021) 20 tablet 0    metoprolol succinate (TOPROL-XL) 100 MG 24 hr tablet Take 100 mg by mouth at bedtime.       NON FORMULARY Pt uses a cpap nightly      ondansetron (ZOFRAN) 4 MG tablet Take 1 tablet (4 mg total) by mouth every 6 (six) hours as needed for nausea. (Patient not taking: Reported on 11/09/2021) 20 tablet 0    oxybutynin (DITROPAN-XL) 5 MG 24 hr tablet Take 5 mg by mouth daily.      OXYGEN Inhale 2 L into the lungs at bedtime.      pantoprazole (PROTONIX) 40 MG tablet Take 40 mg by mouth daily.      polyethylene glycol (MIRALAX / GLYCOLAX) packet Take 17 g by mouth daily as needed for mild constipation. (Patient not taking: Reported on 11/09/2021) 14 each 0    senna (SENOKOT) 8.6 MG TABS tablet Take 2 tablets (17.2 mg total) by mouth at bedtime. (Patient not taking: Reported on 11/09/2021) 120 each 0    traMADol (ULTRAM) 50 MG tablet Take 50 mg by mouth every 6 (six) hours as needed for moderate pain.      triamterene-hydrochlorothiazide (MAXZIDE-25) 37.5-25 MG per tablet Take 1 tablet by mouth daily.      warfarin (COUMADIN) 3 MG tablet Take 3 mg by mouth daily.      No current facility-administered medications for this visit.   No Known Allergies  Social History   Tobacco Use   Smoking status: Never   Smokeless tobacco: Never  Substance Use Topics   Alcohol use: No    Alcohol/week: 0.0 standard drinks of alcohol    Family History  Problem Relation Age of Onset   Liver cancer Mother    Alzheimer's disease Father    Diabetes Maternal Grandfather      Review of  Systems  Musculoskeletal:  Positive for arthralgias, gait problem and myalgias.  All other systems reviewed and are negative.   Objective:  Physical Exam Constitutional:      Appearance: Normal appearance.  HENT:     Head: Normocephalic and atraumatic.     Nose: Nose normal.     Mouth/Throat:     Mouth: Mucous membranes are moist.     Pharynx: Oropharynx is clear.  Eyes:     Extraocular Movements: Extraocular movements intact.  Cardiovascular:     Rate and Rhythm: Normal rate and regular rhythm.     Pulses: Normal pulses.     Heart sounds: Normal heart sounds.  Pulmonary:  Effort: Pulmonary effort is normal.     Breath sounds: Normal breath sounds.  Abdominal:     General: Abdomen is flat.     Palpations: Abdomen is soft.  Genitourinary:    Comments: deferred Musculoskeletal:     Cervical back: Normal range of motion and neck supple.     Comments: Examination of the left hip reveals no skin wounds, lesions, rashes, or erythema. Pain with flexion and rotation of the hip. Trochanteric tenderness to palpation.   Sensory and motor function intact in LE bilaterally. Palpable pedal pulses.   Mild pedal edema at baseline with spider veins bilaterally. Calves soft and non-tender.   Skin:    General: Skin is warm and dry.     Capillary Refill: Capillary refill takes less than 2 seconds.  Neurological:     General: No focal deficit present.     Mental Status: She is alert and oriented to person, place, and time.  Psychiatric:        Mood and Affect: Mood normal.        Behavior: Behavior normal.        Thought Content: Thought content normal.        Judgment: Judgment normal.     Vital signs in last 24 hours: '@VSRANGES'$ @  Labs:   Estimated body mass index is 28.57 kg/m as calculated from the following:   Height as of an earlier encounter on 11/12/21: '5\' 6"'$  (1.676 m).   Weight as of an earlier encounter on 11/12/21: 80.3 kg.   Imaging Review Plain radiographs  demonstrate severe degenerative joint disease of the left hip(s). The bone quality appears to be adequate for age and reported activity level.      Assessment/Plan:  End stage arthritis, left hip(s)  The patient history, physical examination, clinical judgement of the provider and imaging studies are consistent with end stage degenerative joint disease of the left hip(s) and total hip arthroplasty is deemed medically necessary. The treatment options including medical management, injection therapy, arthroscopy and arthroplasty were discussed at length. The risks and benefits of total hip arthroplasty were presented and reviewed. The risks due to aseptic loosening, infection, stiffness, dislocation/subluxation,  thromboembolic complications and other imponderables were discussed.  The patient acknowledged the explanation, agreed to proceed with the plan and consent was signed. Patient is being admitted for inpatient treatment for surgery, pain control, PT, OT, prophylactic antibiotics, VTE prophylaxis, progressive ambulation and ADL's and discharge planning.The patient is planning to be discharged home after an overnight stay with [facility].   Therapy Plans: [facility].  Disposition: Home with husband Planned DVT Prophylaxis: Warfarin DME needed: walker PCP: Cleared Cardiology: Cleared. She is to stop warfarin 5 days prior to surgery.  TXA: IV Allergies:  - Augmentin - diarrhea Anesthesia Concerns: None BMI: 29 Last HgbA1c: Not diabetic Other: - Chronic a-fib - on warfarin - CKD - OSA, uses CPAP - Cr 1.20, Hgb 12.8.  - Hydrocodone, zofran.    Patient's anticipated LOS is less than 2 midnights, meeting these requirements: - Younger than 83 - Lives within 1 hour of care - Has a competent adult at home to recover with post-op recover - NO history of  - Chronic pain requiring opiods  - Diabetes  - Coronary Artery Disease  - Heart failure  - Heart attack  - Stroke  - DVT/VTE  - Cardiac  arrhythmia  - Respiratory Failure/COPD  - Renal failure  - Anemia  - Advanced Liver disease

## 2021-11-19 NOTE — Progress Notes (Signed)
Anesthesia Chart Review   Case: [id number] Date/Time: 11/25/21 1015   Procedure: TOTAL HIP ARTHROPLASTY ANTERIOR APPROACH (Left: Hip)   Anesthesia type: Spinal   Pre-op diagnosis: Left hip osteoarthritis   Location: [facility] ROOM 08 / [facility]   Surgeons: Rod Can, MD       DISCUSSION:81 y.o. never smoker with h/o HTN, OSA, PAF (on Coumadin), CKD Stage 3b, left hip OA scheduled for above procedure 11/25/2021 with Dr. Rod Can.   Pt advised to hold Coumadin 5 days prior to procedure, no Lovenox bridge recommended.   Pt last seen by cardiology 07/20/2021. Stable at this visit.  VS: BP (!) 141/82 Comment: right arm sitting  Pulse 62   Temp 37 C (Oral)   Resp 16   Ht '5\' 6"'$  (1.676 m)   Wt 80.3 kg   SpO2 92%   BMI 28.57 kg/m   PROVIDERS: Jacklynn Ganong, MD is PCP   Tor Netters, MD is Cardiologist  LABS: Labs reviewed: Acceptable for surgery. (all labs ordered are listed, but only abnormal results are displayed)  Labs Reviewed  HEMOGLOBIN A1C - Abnormal; Notable for the following components:      Result Value   Hgb A1c MFr Bld 5.8 (*)    All other components within normal limits  BASIC METABOLIC PANEL - Abnormal; Notable for the following components:   Creatinine, Ser 1.29 (*)    GFR, Estimated 41 (*)    All other components within normal limits  SURGICAL PCR SCREEN  CBC  GLUCOSE, CAPILLARY  TYPE AND SCREEN     IMAGES:   EKG:   CV: Myocardial Perfusion 11/06/2020 Impressions:  - Normal myocardial perfusion study   - No evidence for significant ischemia or scar is noted.   - The reversible apical defect on the prior study from 2017 is not  visualized on this study.   - Post stress:  Global systolic function is hyperdynamic.  The ejection  fraction was greater than 65%.    Echo 10/30/2020 Summary    1. The left ventricle is normal in size with normal wall thickness.    2. The left ventricular systolic function is normal, LVEF is visually  estimated  at > 55%.    3. The right ventricle is mildly dilated in size, with normal systolic  function.    4. The left atrium is moderately to severely dilated in size.    5. The right atrium is moderately dilated  in size.    6. There is mild mitral valve regurgitation.    7. There is mild aortic regurgitation.    8. There is moderate tricuspid regurgitation.    9. There is mild pulmonary hypertension.    10. IVC size and inspiratory change suggest mildly elevated right atrial  pressure. (5-10 mmHg).    11. Rhythm: Atrial Fibrillation.   Echo 10/02/2012 - Left ventricle: The cavity size was normal. Wall thickness    was increased in a pattern of mild LVH. Systolic function    was normal. The estimated ejection fraction was in the    range of 55% to 60%. Wall motion was normal; there were no    regional wall motion abnormalities. Features are    consistent with a pseudonormal left ventricular filling    pattern, with concomitant abnormal relaxation and    increased filling pressure (grade 2 diastolic    dysfunction).  - Aortic valve: Valve area: 2.16cm^2(VTI). Valve area:    1.95cm^2 (Vmax).  - Mitral valve:  Moderate regurgitation directed centrally.    Valve area by pressure half-time: 2.18cm^2. Valve area by    continuity equation (using LVOT flow): 1.95cm^2.  - Left atrium: The atrium was moderately dilated.  - Atrial septum: No defect or patent foramen ovale was    identified.  - Tricuspid valve: Mild-moderate regurgitation.  - Pulmonary arteries: Systolic pressure was mildly    increased. PA peak pressure: 63m Hg (S).  Past Medical History:  Diagnosis Date   Arthritis    right hip   [place]    basal cell skin cancer   Cataract bilateral   Chronic kidney disease    GERD (gastroesophageal reflux disease)    Hypertension    OSA (obstructive sleep apnea)    cpap used nightly   Paroxysmal atrial fibrillation (HCC)    Dr. KTalmadge Chad  Peripheral neuropathy     bilateral feet   Raynaud's syndrome    Raynaud's syndrome    fingers and toes    Past Surgical History:  Procedure Laterality Date   ABDOMINAL HYSTERECTOMY     CATARACT EXTRACTION, BILATERAL     LOWER EXTREMITY VENOUS DOPPLER  11/18/2010   No evidence of thrombus or thrombophlebitis. No venous insufficiency was noted.   TOTAL HIP ARTHROPLASTY Right 12/31/2015   Procedure: RIGHT TOTAL HIP ARTHROPLASTY ANTERIOR APPROACH;  Surgeon: BRod Can MD;  Location: [facility];  Service: Orthopedics;  Laterality: Right;  Needs RNFA    MEDICATIONS:  cholecalciferol (VITAMIN D3) 25 MCG (1000 UNIT) tablet   clobetasol cream (TEMOVATE) 0.05 %   cyanocobalamin (VITAMIN B12) 1000 MCG/ML injection   diclofenac Sodium (VOLTAREN) 1 % GEL   docusate sodium (COLACE) 100 MG capsule   enoxaparin (LOVENOX) 40 MG/0.4ML injection   febuxostat (ULORIC) 40 MG tablet   ferrous sulfate (FERROUSUL) 325 (65 FE) MG tablet   fluticasone (FLONASE) 50 MCG/ACT nasal spray   gabapentin (NEURONTIN) 600 MG tablet   HYDROcodone-acetaminophen (NORCO) 7.5-325 MG tablet   lovastatin (MEVACOR) 40 MG tablet   methocarbamol (ROBAXIN) 500 MG tablet   metoprolol succinate (TOPROL-XL) 100 MG 24 hr tablet   NON FORMULARY   ondansetron (ZOFRAN) 4 MG tablet   oxybutynin (DITROPAN-XL) 5 MG 24 hr tablet   OXYGEN   pantoprazole (PROTONIX) 40 MG tablet   polyethylene glycol (MIRALAX / GLYCOLAX) packet   senna (SENOKOT) 8.6 MG TABS tablet   traMADol (ULTRAM) 50 MG tablet   triamterene-hydrochlorothiazide (MAXZIDE-25) 37.5-25 MG per tablet   warfarin (COUMADIN) 3 MG tablet   No current facility-administered medications for this encounter.     JKonrad FelixWard, PA-C [facility] ([phone]

## 2021-11-25 ENCOUNTER — Other Ambulatory Visit: Payer: Self-pay

## 2021-11-25 ENCOUNTER — Ambulatory Visit (HOSPITAL_COMMUNITY): Payer: Medicare HMO

## 2021-11-25 ENCOUNTER — Ambulatory Visit (HOSPITAL_BASED_OUTPATIENT_CLINIC_OR_DEPARTMENT_OTHER): Payer: Medicare HMO | Admitting: Anesthesiology

## 2021-11-25 ENCOUNTER — Encounter (HOSPITAL_COMMUNITY)
Admission: RE | Disposition: A | Discharge status: Home / Self Care | Payer: Self-pay | Source: Ambulatory Visit | Attending: Orthopedic Surgery

## 2021-11-25 ENCOUNTER — Ambulatory Visit (HOSPITAL_COMMUNITY)
Admission: RE | Admit: 2021-11-25 | Discharge: 2021-11-26 | Disposition: A | Discharge status: Home / Self Care | Payer: Medicare HMO | Source: Ambulatory Visit | Attending: Orthopedic Surgery | Admitting: Orthopedic Surgery

## 2021-11-25 ENCOUNTER — Encounter (HOSPITAL_COMMUNITY): Payer: Self-pay | Admitting: Orthopedic Surgery

## 2021-11-25 ENCOUNTER — Ambulatory Visit (HOSPITAL_COMMUNITY): Payer: Medicare HMO | Admitting: Physician Assistant

## 2021-11-25 DIAGNOSIS — G4733 Obstructive sleep apnea (adult) (pediatric): Secondary | ICD-10-CM | POA: Diagnosis not present

## 2021-11-25 DIAGNOSIS — Z79899 Other long term (current) drug therapy: Secondary | ICD-10-CM | POA: Diagnosis not present

## 2021-11-25 DIAGNOSIS — I4891 Unspecified atrial fibrillation: Secondary | ICD-10-CM | POA: Diagnosis not present

## 2021-11-25 DIAGNOSIS — G609 Hereditary and idiopathic neuropathy, unspecified: Secondary | ICD-10-CM | POA: Insufficient documentation

## 2021-11-25 DIAGNOSIS — M1612 Unilateral primary osteoarthritis, left hip: Secondary | ICD-10-CM | POA: Diagnosis present

## 2021-11-25 DIAGNOSIS — Z7901 Long term (current) use of anticoagulants: Secondary | ICD-10-CM

## 2021-11-25 DIAGNOSIS — Z96642 Presence of left artificial hip joint: Secondary | ICD-10-CM

## 2021-11-25 DIAGNOSIS — R7303 Prediabetes: Secondary | ICD-10-CM

## 2021-11-25 DIAGNOSIS — Z9989 Dependence on other enabling machines and devices: Secondary | ICD-10-CM | POA: Diagnosis not present

## 2021-11-25 DIAGNOSIS — I110 Hypertensive heart disease with heart failure: Secondary | ICD-10-CM | POA: Diagnosis not present

## 2021-11-25 DIAGNOSIS — I509 Heart failure, unspecified: Secondary | ICD-10-CM | POA: Diagnosis not present

## 2021-11-25 DIAGNOSIS — K219 Gastro-esophageal reflux disease without esophagitis: Secondary | ICD-10-CM | POA: Insufficient documentation

## 2021-11-25 HISTORY — PX: TOTAL HIP ARTHROPLASTY: SHX124

## 2021-11-25 LAB — APTT: aPTT: 29 seconds (ref 24–36)

## 2021-11-25 LAB — CBC
HCT: 37.2 % (ref 36.0–46.0)
Hemoglobin: 11.5 g/dL — ABNORMAL LOW (ref 12.0–15.0)
MCH: 29.6 pg (ref 26.0–34.0)
MCHC: 30.9 g/dL (ref 30.0–36.0)
MCV: 95.9 fL (ref 80.0–100.0)
Platelets: 258 10*3/uL (ref 150–400)
RBC: 3.88 MIL/uL (ref 3.87–5.11)
RDW: 13.3 % (ref 11.5–15.5)
WBC: 15 10*3/uL — ABNORMAL HIGH (ref 4.0–10.5)
nRBC: 0 % (ref 0.0–0.2)

## 2021-11-25 LAB — PROTIME-INR
INR: 1.3 — ABNORMAL HIGH (ref 0.8–1.2)
Prothrombin Time: 16.4 seconds — ABNORMAL HIGH (ref 11.4–15.2)

## 2021-11-25 LAB — CREATININE, SERUM
Creatinine, Ser: 1.01 mg/dL — ABNORMAL HIGH (ref 0.44–1.00)
GFR, Estimated: 55 mL/min — ABNORMAL LOW (ref >60)

## 2021-11-25 SURGERY — ARTHROPLASTY, HIP, TOTAL, ANTERIOR APPROACH
Anesthesia: Monitor Anesthesia Care | Site: Hip | Laterality: Left

## 2021-11-25 MED ORDER — HYDROMORPHONE HCL 1 MG/ML IJ SOLN: 0.2500 mg | INTRAMUSCULAR | Status: DC | PRN

## 2021-11-25 MED ORDER — PHENOL 1.4 % MT LIQD: 1.0000 | OROMUCOSAL | Status: DC | PRN

## 2021-11-25 MED ORDER — TRAMADOL HCL 50 MG PO TABS
50.0000 mg | ORAL_TABLET | Freq: Four times a day (QID) | ORAL | Status: DC
  Administered 2021-11-25 – 2021-11-26 (×4): 50 mg via ORAL
  Filled 2021-11-25 (×4): qty 1

## 2021-11-25 MED ORDER — DIPHENHYDRAMINE HCL 12.5 MG/5ML PO ELIX: 12.5000 mg | ORAL_SOLUTION | ORAL | Status: DC | PRN

## 2021-11-25 MED ORDER — KETOROLAC TROMETHAMINE 30 MG/ML IJ SOLN
INTRAMUSCULAR | Status: AC
  Filled 2021-11-25: qty 1

## 2021-11-25 MED ORDER — OXYCODONE HCL 5 MG PO TABS: 5.0000 mg | ORAL_TABLET | Freq: Once | ORAL | Status: DC | PRN

## 2021-11-25 MED ORDER — ONDANSETRON HCL 4 MG/2ML IJ SOLN
INTRAMUSCULAR | Status: DC | PRN
  Administered 2021-11-25: 4 mg via INTRAVENOUS

## 2021-11-25 MED ORDER — SODIUM CHLORIDE 0.9 % IR SOLN
Status: DC | PRN
  Administered 2021-11-25 (×2): 1000 mL

## 2021-11-25 MED ORDER — ONDANSETRON HCL 4 MG PO TABS: 4.0000 mg | ORAL_TABLET | Freq: Four times a day (QID) | ORAL | Status: DC | PRN

## 2021-11-25 MED ORDER — KETOROLAC TROMETHAMINE 30 MG/ML IJ SOLN
INTRAMUSCULAR | Status: DC | PRN
  Administered 2021-11-25: 30 mg

## 2021-11-25 MED ORDER — SODIUM CHLORIDE (PF) 0.9 % IJ SOLN
INTRAMUSCULAR | Status: DC | PRN
  Administered 2021-11-25: 30 mL

## 2021-11-25 MED ORDER — TRANEXAMIC ACID-NACL 1000-0.7 MG/100ML-% IV SOLN
1000.0000 mg | INTRAVENOUS | Status: AC
  Administered 2021-11-25: 1000 mg via INTRAVENOUS
  Filled 2021-11-25: qty 100

## 2021-11-25 MED ORDER — HYDROCODONE-ACETAMINOPHEN 5-325 MG PO TABS: 1.0000 | ORAL_TABLET | ORAL | Status: DC | PRN

## 2021-11-25 MED ORDER — FEBUXOSTAT 40 MG PO TABS
40.0000 mg | ORAL_TABLET | Freq: Every day | ORAL | Status: DC
  Administered 2021-11-26: 40 mg via ORAL
  Filled 2021-11-25 (×2): qty 1

## 2021-11-25 MED ORDER — ACETAMINOPHEN 500 MG PO TABS
1000.0000 mg | ORAL_TABLET | Freq: Once | ORAL | Status: DC
  Filled 2021-11-25: qty 2

## 2021-11-25 MED ORDER — EPHEDRINE 5 MG/ML INJ
INTRAVENOUS | Status: AC
  Filled 2021-11-25: qty 5

## 2021-11-25 MED ORDER — PROPOFOL 10 MG/ML IV BOLUS
INTRAVENOUS | Status: DC | PRN
  Administered 2021-11-25: 20 mg via INTRAVENOUS

## 2021-11-25 MED ORDER — LACTATED RINGERS IV SOLN: INTRAVENOUS | Status: DC

## 2021-11-25 MED ORDER — METOCLOPRAMIDE HCL 5 MG/ML IJ SOLN: 5.0000 mg | Freq: Three times a day (TID) | INTRAMUSCULAR | Status: DC | PRN

## 2021-11-25 MED ORDER — FLUTICASONE PROPIONATE 50 MCG/ACT NA SUSP: 2.0000 | Freq: Every day | NASAL | Status: DC | PRN

## 2021-11-25 MED ORDER — WARFARIN SODIUM 5 MG PO TABS
5.0000 mg | ORAL_TABLET | Freq: Once | ORAL | Status: AC
  Administered 2021-11-25: 5 mg via ORAL
  Filled 2021-11-25 (×2): qty 1

## 2021-11-25 MED ORDER — SODIUM CHLORIDE (PF) 0.9 % IJ SOLN
INTRAMUSCULAR | Status: AC
  Filled 2021-11-25: qty 50

## 2021-11-25 MED ORDER — BUPIVACAINE-EPINEPHRINE (PF) 0.25% -1:200000 IJ SOLN
INTRAMUSCULAR | Status: AC
  Filled 2021-11-25: qty 30

## 2021-11-25 MED ORDER — CEFAZOLIN SODIUM-DEXTROSE 2-4 GM/100ML-% IV SOLN
2.0000 g | INTRAVENOUS | Status: AC
  Administered 2021-11-25: 2 g via INTRAVENOUS
  Filled 2021-11-25: qty 100

## 2021-11-25 MED ORDER — METHOCARBAMOL 500 MG IVPB - SIMPLE MED: 500.0000 mg | Freq: Four times a day (QID) | INTRAVENOUS | Status: DC | PRN

## 2021-11-25 MED ORDER — CHLORHEXIDINE GLUCONATE 0.12 % MT SOLN
15.0000 mL | Freq: Once | OROMUCOSAL | Status: AC
  Administered 2021-11-25: 15 mL via OROMUCOSAL

## 2021-11-25 MED ORDER — BUPIVACAINE IN DEXTROSE 0.75-8.25 % IT SOLN
INTRATHECAL | Status: DC | PRN
  Administered 2021-11-25: 2 mL via INTRATHECAL

## 2021-11-25 MED ORDER — MENTHOL 3 MG MT LOZG: 1.0000 | LOZENGE | OROMUCOSAL | Status: DC | PRN

## 2021-11-25 MED ORDER — GABAPENTIN 300 MG PO CAPS
600.0000 mg | ORAL_CAPSULE | Freq: Three times a day (TID) | ORAL | Status: DC
  Administered 2021-11-25 – 2021-11-26 (×4): 600 mg via ORAL
  Filled 2021-11-25 (×4): qty 2

## 2021-11-25 MED ORDER — PRAVASTATIN SODIUM 20 MG PO TABS
40.0000 mg | ORAL_TABLET | Freq: Every day | ORAL | Status: DC
  Filled 2021-11-25: qty 2

## 2021-11-25 MED ORDER — ALUM & MAG HYDROXIDE-SIMETH 200-200-20 MG/5ML PO SUSP: 30.0000 mL | ORAL | Status: DC | PRN

## 2021-11-25 MED ORDER — PHENYLEPHRINE 80 MCG/ML (10ML) SYRINGE FOR IV PUSH (FOR BLOOD PRESSURE SUPPORT)
PREFILLED_SYRINGE | INTRAVENOUS | Status: AC
  Filled 2021-11-25: qty 10

## 2021-11-25 MED ORDER — 0.9 % SODIUM CHLORIDE (POUR BTL) OPTIME
TOPICAL | Status: DC | PRN
  Administered 2021-11-25: 1000 mL

## 2021-11-25 MED ORDER — DOCUSATE SODIUM 100 MG PO CAPS
100.0000 mg | ORAL_CAPSULE | Freq: Two times a day (BID) | ORAL | Status: DC
  Administered 2021-11-25 – 2021-11-26 (×2): 100 mg via ORAL
  Filled 2021-11-25 (×2): qty 1

## 2021-11-25 MED ORDER — METOCLOPRAMIDE HCL 5 MG PO TABS: 5.0000 mg | ORAL_TABLET | Freq: Three times a day (TID) | ORAL | Status: DC | PRN

## 2021-11-25 MED ORDER — METHOCARBAMOL 500 MG PO TABS: 500.0000 mg | ORAL_TABLET | Freq: Four times a day (QID) | ORAL | Status: DC | PRN

## 2021-11-25 MED ORDER — PROPOFOL 1000 MG/100ML IV EMUL
INTRAVENOUS | Status: AC
  Filled 2021-11-25: qty 100

## 2021-11-25 MED ORDER — ONDANSETRON HCL 4 MG/2ML IJ SOLN: 4.0000 mg | Freq: Four times a day (QID) | INTRAMUSCULAR | Status: DC | PRN

## 2021-11-25 MED ORDER — SENNA 8.6 MG PO TABS
1.0000 | ORAL_TABLET | Freq: Two times a day (BID) | ORAL | Status: DC
  Administered 2021-11-26: 8.6 mg via ORAL
  Filled 2021-11-25 (×2): qty 1

## 2021-11-25 MED ORDER — SODIUM CHLORIDE 0.9 % IV SOLN: INTRAVENOUS | Status: DC

## 2021-11-25 MED ORDER — WARFARIN - PHARMACIST DOSING INPATIENT: Freq: Every day | Status: DC

## 2021-11-25 MED ORDER — DEXAMETHASONE SODIUM PHOSPHATE 10 MG/ML IJ SOLN
INTRAMUSCULAR | Status: AC
  Filled 2021-11-25: qty 1

## 2021-11-25 MED ORDER — MORPHINE SULFATE (PF) 2 MG/ML IV SOLN: 0.5000 mg | INTRAVENOUS | Status: DC | PRN

## 2021-11-25 MED ORDER — CEFAZOLIN SODIUM-DEXTROSE 2-4 GM/100ML-% IV SOLN
2.0000 g | Freq: Four times a day (QID) | INTRAVENOUS | Status: AC
  Administered 2021-11-25 (×2): 2 g via INTRAVENOUS
  Filled 2021-11-25 (×2): qty 100

## 2021-11-25 MED ORDER — OXYCODONE HCL 5 MG/5ML PO SOLN: 5.0000 mg | Freq: Once | ORAL | Status: DC | PRN

## 2021-11-25 MED ORDER — VITAMIN D 25 MCG (1000 UNIT) PO TABS
1000.0000 [IU] | ORAL_TABLET | Freq: Every day | ORAL | Status: DC
  Administered 2021-11-25 – 2021-11-26 (×2): 1000 [IU] via ORAL
  Filled 2021-11-25 (×2): qty 1

## 2021-11-25 MED ORDER — ONDANSETRON HCL 4 MG/2ML IJ SOLN
INTRAMUSCULAR | Status: AC
  Filled 2021-11-25: qty 2

## 2021-11-25 MED ORDER — WATER FOR IRRIGATION, STERILE IR SOLN
Status: DC | PRN
  Administered 2021-11-25: 2000 mL

## 2021-11-25 MED ORDER — ACETAMINOPHEN 500 MG PO TABS
1000.0000 mg | ORAL_TABLET | Freq: Once | ORAL | Status: AC
  Administered 2021-11-25: 1000 mg via ORAL

## 2021-11-25 MED ORDER — AMISULPRIDE (ANTIEMETIC) 5 MG/2ML IV SOLN: 10.0000 mg | Freq: Once | INTRAVENOUS | Status: DC | PRN

## 2021-11-25 MED ORDER — PHENYLEPHRINE HCL-NACL 20-0.9 MG/250ML-% IV SOLN
INTRAVENOUS | Status: DC | PRN
  Administered 2021-11-25: 30 ug/min via INTRAVENOUS

## 2021-11-25 MED ORDER — PANTOPRAZOLE SODIUM 40 MG PO TBEC
40.0000 mg | DELAYED_RELEASE_TABLET | Freq: Every day | ORAL | Status: DC
  Administered 2021-11-26: 40 mg via ORAL
  Filled 2021-11-25 (×2): qty 1

## 2021-11-25 MED ORDER — ISOPROPYL ALCOHOL 70 % SOLN
Status: DC | PRN
  Administered 2021-11-25: 1 via TOPICAL

## 2021-11-25 MED ORDER — ENOXAPARIN SODIUM 40 MG/0.4ML IJ SOSY
40.0000 mg | PREFILLED_SYRINGE | INTRAMUSCULAR | Status: DC
  Administered 2021-11-26: 40 mg via SUBCUTANEOUS
  Filled 2021-11-25: qty 0.4

## 2021-11-25 MED ORDER — POLYETHYLENE GLYCOL 3350 17 G PO PACK: 17.0000 g | PACK | Freq: Every day | ORAL | Status: DC | PRN

## 2021-11-25 MED ORDER — ONDANSETRON HCL 4 MG/2ML IJ SOLN: 4.0000 mg | Freq: Once | INTRAMUSCULAR | Status: DC | PRN

## 2021-11-25 MED ORDER — HYDROCODONE-ACETAMINOPHEN 7.5-325 MG PO TABS: 1.0000 | ORAL_TABLET | ORAL | Status: DC | PRN

## 2021-11-25 MED ORDER — BUPIVACAINE-EPINEPHRINE 0.25% -1:200000 IJ SOLN
INTRAMUSCULAR | Status: DC | PRN
  Administered 2021-11-25: 30 mL

## 2021-11-25 MED ORDER — ACETAMINOPHEN 325 MG PO TABS: 325.0000 mg | ORAL_TABLET | Freq: Four times a day (QID) | ORAL | Status: DC | PRN

## 2021-11-25 MED ORDER — OXYBUTYNIN CHLORIDE ER 5 MG PO TB24
5.0000 mg | ORAL_TABLET | Freq: Every day | ORAL | Status: DC
  Administered 2021-11-25 – 2021-11-26 (×2): 5 mg via ORAL
  Filled 2021-11-25 (×2): qty 1

## 2021-11-25 MED ORDER — PROPOFOL 500 MG/50ML IV EMUL
INTRAVENOUS | Status: DC | PRN
  Administered 2021-11-25: 50 ug/kg/min via INTRAVENOUS

## 2021-11-25 MED ORDER — POVIDONE-IODINE 10 % EX SWAB
2.0000 | Freq: Once | CUTANEOUS | Status: AC
  Administered 2021-11-25: 2 via TOPICAL

## 2021-11-25 MED ORDER — METOPROLOL SUCCINATE ER 50 MG PO TB24
100.0000 mg | ORAL_TABLET | Freq: Every day | ORAL | Status: DC
  Administered 2021-11-25: 100 mg via ORAL
  Filled 2021-11-25: qty 2

## 2021-11-25 MED ORDER — ORAL CARE MOUTH RINSE: 15.0000 mL | Freq: Once | OROMUCOSAL | Status: AC

## 2021-11-25 MED ORDER — POVIDONE-IODINE 10 % EX SWAB: 2.0000 | Freq: Once | CUTANEOUS | Status: DC

## 2021-11-25 MED ORDER — DEXAMETHASONE SODIUM PHOSPHATE 10 MG/ML IJ SOLN
INTRAMUSCULAR | Status: DC | PRN
  Administered 2021-11-25: 5 mg via INTRAVENOUS

## 2021-11-25 SURGICAL SUPPLY — 56 items
ACE SHELL 3H 52 E HIP (Shell) ×1 IMPLANT
BAG COUNTER SPONGE SURGICOUNT (BAG) IMPLANT
BAG DECANTER FOR FLEXI CONT (MISCELLANEOUS) IMPLANT
BAG ZIPLOCK 12X15 (MISCELLANEOUS) IMPLANT
CHLORAPREP W/TINT 26 (MISCELLANEOUS) ×1 IMPLANT
COVER PERINEAL POST (MISCELLANEOUS) ×1 IMPLANT
COVER SURGICAL LIGHT HANDLE (MISCELLANEOUS) ×1 IMPLANT
DERMABOND ADVANCED .7 DNX12 (GAUZE/BANDAGES/DRESSINGS) ×2 IMPLANT
DRAPE IMP U-DRAPE 54X76 (DRAPES) ×1 IMPLANT
DRAPE SHEET LG 3/4 BI-LAMINATE (DRAPES) ×3 IMPLANT
DRAPE STERI IOBAN 125X83 (DRAPES) ×1 IMPLANT
DRAPE U-SHAPE 47X51 STRL (DRAPES) ×2 IMPLANT
DRSG AQUACEL AG ADV 3.5X10 (GAUZE/BANDAGES/DRESSINGS) ×1 IMPLANT
ELECT REM PT RETURN 15FT ADLT (MISCELLANEOUS) ×1 IMPLANT
GAUZE SPONGE 4X4 12PLY STRL (GAUZE/BANDAGES/DRESSINGS) ×1 IMPLANT
GLOVE BIO SURGEON STRL SZ8.5 (GLOVE) ×2 IMPLANT
GLOVE BIOGEL M 7.0 STRL (GLOVE) ×1 IMPLANT
GLOVE BIOGEL PI IND STRL 7.5 (GLOVE) ×1 IMPLANT
GLOVE BIOGEL PI IND STRL 8.5 (GLOVE) ×1 IMPLANT
GLOVE SURG LX STRL 7.5 STRW (GLOVE) ×2 IMPLANT
GOWN SPEC L3 XXLG W/TWL (GOWN DISPOSABLE) ×1 IMPLANT
GOWN STRL REUS W/ TWL XL LVL3 (GOWN DISPOSABLE) ×1 IMPLANT
GOWN STRL REUS W/TWL XL LVL3 (GOWN DISPOSABLE) ×1
HANDPIECE INTERPULSE COAX TIP (DISPOSABLE) ×1
HEAD FEM -3XOFST 36XMDLR (Head) IMPLANT
HEAD MODULAR 36MM (Head) ×1 IMPLANT
HOLDER FOLEY CATH W/STRAP (MISCELLANEOUS) ×1 IMPLANT
HOOD PEEL AWAY FLYTE STAYCOOL (MISCELLANEOUS) ×3 IMPLANT
KIT TURNOVER KIT A (KITS) IMPLANT
LINER ACE G7 HIGH 36 SZ E (Liner) IMPLANT
MANIFOLD NEPTUNE II (INSTRUMENTS) ×1 IMPLANT
MARKER SKIN DUAL TIP RULER LAB (MISCELLANEOUS) ×1 IMPLANT
NDL SAFETY ECLIP 18X1.5 (MISCELLANEOUS) ×1 IMPLANT
NDL SPNL 18GX3.5 QUINCKE PK (NEEDLE) ×1 IMPLANT
NEEDLE SPNL 18GX3.5 QUINCKE PK (NEEDLE) ×1 IMPLANT
PACK ANTERIOR HIP CUSTOM (KITS) ×1 IMPLANT
PENCIL SMOKE EVACUATOR (MISCELLANEOUS) IMPLANT
SAW OSC TIP CART 19.5X105X1.3 (SAW) ×1 IMPLANT
SEALER BIPOLAR AQUA 6.0 (INSTRUMENTS) ×1 IMPLANT
SET HNDPC FAN SPRY TIP SCT (DISPOSABLE) ×1 IMPLANT
SHELL ACETAB 3H 52 E HIP (Shell) IMPLANT
SOLUTION PRONTOSAN WOUND 350ML (IRRIGATION / IRRIGATOR) ×1 IMPLANT
SPIKE FLUID TRANSFER (MISCELLANEOUS) ×1 IMPLANT
STEM FEM CMTLS HO 12X109 133D (Stem) IMPLANT
SUT MNCRL AB 3-0 PS2 18 (SUTURE) ×1 IMPLANT
SUT MON AB 2-0 CT1 36 (SUTURE) ×1 IMPLANT
SUT STRATAFIX PDO 1 14 VIOLET (SUTURE) ×1
SUT STRATFX PDO 1 14 VIOLET (SUTURE) ×1
SUT VIC AB 2-0 CT1 27 (SUTURE)
SUT VIC AB 2-0 CT1 TAPERPNT 27 (SUTURE) IMPLANT
SUTURE STRATFX PDO 1 14 VIOLET (SUTURE) ×1 IMPLANT
SYR 3ML LL SCALE MARK (SYRINGE) ×1 IMPLANT
TRAY FOLEY MTR SLVR 14FR STAT (SET/KITS/TRAYS/PACK) IMPLANT
TRAY FOLEY MTR SLVR 16FR STAT (SET/KITS/TRAYS/PACK) IMPLANT
TUBE SUCTION HIGH CAP CLEAR NV (SUCTIONS) ×1 IMPLANT
WATER STERILE IRR 1000ML POUR (IV SOLUTION) ×1 IMPLANT

## 2021-11-25 NOTE — Anesthesia Preprocedure Evaluation (Addendum)
Anesthesia Evaluation  Patient identified by MRN, date of birth, ID band Patient awake    Reviewed: Allergy & Precautions, NPO status , Patient's Chart, lab work & pertinent test results, reviewed documented beta blocker date and time   Airway Mallampati: II  TM Distance: >3 FB Neck ROM: Full    Dental  (+) Edentulous Upper   Pulmonary sleep apnea and Continuous Positive Airway Pressure Ventilation ,    Pulmonary exam normal breath sounds clear to auscultation       Cardiovascular hypertension (139/87 in preop), Pt. on medications and Pt. on home beta blockers +CHF (normal LVEF< grade 2 diastolic dysfunction)  Normal cardiovascular exam+ dysrhythmias Atrial Fibrillation  Rhythm:Regular Rate:Normal  Echo 2014 Left ventricle: The cavity size was normal. Wall thickness  was increased in a pattern of mild LVH. Systolic function  was normal. The estimated ejection fraction was in the range  of 55% to 60%. Wall motion was normal; there were no  regional wall motion abnormalities. Features are consistent  with a pseudonormal left ventricular filling pattern, with  concomitant abnormal relaxation and increased filling  pressure (grade 2 diastolic dysfunction).     Neuro/Psych  Neuromuscular disease (peripheral neuropathy ) negative psych ROS   GI/Hepatic Neg liver ROS, GERD  Controlled and Medicated,  Endo/Other  negative endocrine ROS  Renal/GU Renal InsufficiencyRenal diseaseCr 1.3  negative genitourinary   Musculoskeletal  (+) Arthritis , Osteoarthritis,    Abdominal   Peds  Hematology negative hematology ROS (+) Hb 13   Anesthesia Other Findings   Reproductive/Obstetrics negative OB ROS                            Anesthesia Physical Anesthesia Plan  ASA: 3  Anesthesia Plan: MAC and Spinal   Post-op Pain Management: Tylenol PO (pre-op)*   Induction:   PONV Risk Score and Plan: 2 and  Propofol infusion and TIVA  Airway Management Planned: Natural Airway and Simple Face Mask  Additional Equipment: None  Intra-op Plan:   Post-operative Plan:   Informed Consent: I have reviewed the patients History and Physical, chart, labs and discussed the procedure including the risks, benefits and alternatives for the proposed anesthesia with the patient or authorized representative who has indicated his/her understanding and acceptance.     Dental advisory given  Plan Discussed with: CRNA  Anesthesia Plan Comments:        Anesthesia Quick Evaluation

## 2021-11-25 NOTE — Transfer of Care (Signed)
Immediate Anesthesia Transfer of Care Note  Patient: Ebony Mason  Procedure(s) Performed: TOTAL HIP ARTHROPLASTY ANTERIOR APPROACH (Left: Hip)  Patient Location: PACU  Anesthesia Type:Spinal  Level of Consciousness: drowsy  Airway & Oxygen Therapy: Patient Spontanous Breathing and Patient connected to face mask  Post-op Assessment: Report given to RN and Post -op Vital signs reviewed and stable  Post vital signs: Reviewed and stable  Last Vitals:  Vitals Value Taken Time  BP 80/57 11/25/21 1316  Temp    Pulse 65 11/25/21 1317  Resp 15 11/25/21 1317  SpO2 98 % 11/25/21 1317  Vitals shown include unvalidated device data.  Last Pain:  Vitals:   11/25/21 0834  TempSrc: Oral      Patients Stated Pain Goal: 4 (26/71/24 5809)  Complications: No notable events documented.

## 2021-11-25 NOTE — Anesthesia Procedure Notes (Signed)
Procedure Name: MAC Date/Time: 11/25/2021 11:10 AM  Performed by: Claudia Desanctis, CRNAPre-anesthesia Checklist: Patient identified, Emergency Drugs available, Suction available and Patient being monitored Patient Re-evaluated:Patient Re-evaluated prior to induction Oxygen Delivery Method: Simple face mask

## 2021-11-25 NOTE — Anesthesia Procedure Notes (Signed)
Spinal  Patient location during procedure: OR Start time: 11/25/2021 11:07 AM End time: 11/25/2021 11:11 AM Reason for block: surgical anesthesia Staffing Performed: resident/CRNA  Anesthesiologist: Finucane, Elizabeth M, DO Resident/CRNA: Cochran, Ronald Glenn, CRNA Performed by: Cochran, Ronald Glenn, CRNA Authorized by: Finucane, Elizabeth M, DO   Preanesthetic Checklist Completed: patient identified, IV checked, site marked, risks and benefits discussed, surgical consent, monitors and equipment checked, pre-op evaluation and timeout performed Spinal Block Patient position: sitting Prep: DuraPrep Patient monitoring: heart rate, cardiac monitor, continuous pulse ox and blood pressure Approach: midline Location: L3-4 Injection technique: single-shot Needle Needle type: Pencan  Needle gauge: 24 G Needle length: 10 cm Needle insertion depth: 7 cm Assessment Sensory level: T4 Events: CSF return Additional Notes IV functioning, monitors applied to pt. Expiration date of kit checked and confirmed to be in date. Sterile prep and drape, hand hygiene and sterile gloved used. Pt was positioned and spine was prepped in sterile fashion. Skin was anesthetized with lidocaine. Free flow of clear CSF obtained prior to injecting local anesthetic into CSF x 1 attempt. Spinal needle aspirated freely following injection. Needle was carefully withdrawn, and pt tolerated procedure well. Loss of motor and sensory on exam post injection.      

## 2021-11-25 NOTE — Interval H&P Note (Signed)
History and Physical Interval Note:  11/25/2021 9:35 AM  Ebony Mason  has presented today for surgery, with the diagnosis of Left hip osteoarthritis.  The various methods of treatment have been discussed with the patient and family. After consideration of risks, benefits and other options for treatment, the patient has consented to  Procedure(s): TOTAL HIP ARTHROPLASTY ANTERIOR APPROACH (Left) as a surgical intervention.  The patient's history has been reviewed, patient examined, no change in status, stable for surgery.  I have reviewed the patient's chart and labs.  Questions were answered to the patient's satisfaction.     Hilton Cork Jacqlyn Marolf

## 2021-11-25 NOTE — Anesthesia Postprocedure Evaluation (Signed)
Anesthesia Post Note  Patient: Ebony Mason  Procedure(s) Performed: TOTAL HIP ARTHROPLASTY ANTERIOR APPROACH (Left: Hip)     Patient location during evaluation: PACU Anesthesia Type: MAC and Spinal Level of consciousness: awake and alert and oriented Pain management: pain level controlled Vital Signs Assessment: post-procedure vital signs reviewed and stable Respiratory status: spontaneous breathing, nonlabored ventilation and respiratory function stable Cardiovascular status: blood pressure returned to baseline and stable Postop Assessment: no headache, no backache, spinal receding and patient able to bend at knees Anesthetic complications: no   No notable events documented.  Last Vitals:  Vitals:   11/25/21 1500 11/25/21 1609  BP: (!) 100/56 114/72  Pulse: (!) 56 63  Resp: 11 14  Temp:    SpO2: 100% 97%    Last Pain:  Vitals:   11/25/21 1609  TempSrc:   PainSc: 0-No pain                 Pervis Hocking

## 2021-11-25 NOTE — Op Note (Signed)
OPERATIVE REPORT  SURGEON: Rod Can, MD   ASSISTANT: Larene Pickett, PA-C.  PREOPERATIVE DIAGNOSIS: Left hip arthritis.   POSTOPERATIVE DIAGNOSIS: Left hip arthritis.   PROCEDURE: Right total hip arthroplasty, anterior approach.   IMPLANTS: Biomet Taperloc Complete Microplasty stem, size 12 x 109 mm, high offset. Biomet G7 OsseoTi Cup, size 52 mm. Biomet Vivacit-E liner, size 36 mm, E, neutral. Biomet Biolox ceramic head ball, size 36 - 3 mm.  ANESTHESIA:  MAC and Spinal  ESTIMATED BLOOD LOSS:-300 mL    ANTIBIOTICS: 2 g Ancef.  DRAINS: None.  COMPLICATIONS: None.   CONDITION: PACU - hemodynamically stable.   BRIEF CLINICAL NOTE: MARJEAN IMPERATO is a 81 y.o. female with a long-standing history of Left hip arthritis. After failing conservative management, the patient was indicated for total hip arthroplasty. The risks, benefits, and alternatives to the procedure were explained, and the patient elected to proceed.  PROCEDURE IN DETAIL: Surgical site was marked by myself in the pre-op holding area. Once inside the operating room, spinal anesthesia was obtained, and a foley catheter was inserted. The patient was then positioned on the Hana table.  All bony prominences were well padded.  The hip was prepped and draped in the normal sterile surgical fashion.  A time-out was called verifying side and site of surgery. The patient received IV antibiotics within 60 minutes of beginning the procedure.   Bikini incision was made, and superficial dissection was performed lateral to the ASIS. The direct anterior approach to the hip was performed through the Hueter interval.  Lateral femoral circumflex vessels were treated with the Auqumantys. The anterior capsule was exposed and an inverted T capsulotomy was made. The femoral neck cut was made to the level of the templated cut.  A corkscrew was placed into the head and the head was removed.  The femoral head was found to have eburnated  bone. The head was passed to the back table and was measured. Pubofemoral ligament was released off of the calcar, taking care to stay on bone. Superior capsule was released from the greater trochanter, taking care to stay lateral to the posterior border of the femoral neck in order to preserve the short external rotators.   Acetabular exposure was achieved, and the pulvinar and labrum were excised. Sequential reaming of the acetabulum was then performed up to a size 51 mm reamer. A 52 mm cup was then opened and impacted into place at approximately 40 degrees of abduction and 20 degrees of anteversion. The final polyethylene liner was impacted into place and acetabular osteophytes were removed.    I then gained femoral exposure taking care to protect the abductors and greater trochanter.  This was performed using standard external rotation, extension, and adduction.  A cookie cutter was used to enter the femoral canal, and then the femoral canal finder was placed.  Sequential broaching was performed up to a size 12.  Calcar planer was used on the femoral neck remnant.  I placed a high offset neck and a trial head ball.  The hip was reduced.  Leg lengths and offset were checked fluoroscopically.  The hip was dislocated and trial components were removed.  The final implants were placed, and the hip was reduced.  Fluoroscopy was used to confirm component position and leg lengths.  At 90 degrees of external rotation and full extension, the hip was stable to an anterior directed force.   The wound was copiously irrigated with Prontosan solution and normal saline using pule lavage.  Marcaine solution was injected into the periarticular soft tissue.  The wound was closed in layers using #1 Vicryl and V-Loc for the fascia, 2-0 Vicryl for the subcutaneous fat, 2-0 Monocryl for the deep dermal layer, 3-0 running Monocryl subcuticular stitch, and Dermabond for the skin.  Once the glue was fully dried, an Aquacell Ag  dressing was applied.  The patient was transported to the recovery room in stable condition.  Sponge, needle, and instrument counts were correct at the end of the case x2.  The patient tolerated the procedure well and there were no known complications.  Please note that a surgical assistant was a medical necessity for this procedure to perform it in a safe and expeditious manner. Assistant was necessary to provide appropriate retraction of vital neurovascular structures, to prevent femoral fracture, and to allow for anatomic placement of the prosthesis.

## 2021-11-25 NOTE — Discharge Instructions (Signed)
? ?Dr. Brian Swinteck ?Joint Replacement Specialist ?[facility] ?[address] ?[place], [place] [address] ?[phone] ? ? ?TOTAL HIP REPLACEMENT POSTOPERATIVE DIRECTIONS ? ? ? ?Hip Rehabilitation, Guidelines Following Surgery  ? ?WEIGHT BEARING ?Weight bearing as tolerated with assist device (walker, cane, etc) as directed, use it as long as suggested by your surgeon or therapist, typically at least 4-6 weeks. ? ?The results of a hip operation are greatly improved after range of motion and muscle strengthening exercises. Follow all safety measures which are given to protect your hip. If any of these exercises cause increased pain or swelling in your joint, decrease the amount until you are comfortable again. Then slowly increase the exercises. Call your caregiver if you have problems or questions.  ? ?HOME CARE INSTRUCTIONS  ?Most of the following instructions are designed to prevent the dislocation of your new hip.  ?Remove items at home which could result in a fall. This includes throw rugs or furniture in walking pathways.  ?Continue medications as instructed at time of discharge. ?You may have some home medications which will be placed on hold until you complete the course of blood thinner medication. ?You may start showering once you are discharged home. Do not remove your dressing. ?Do not put on socks or shoes without following the instructions of your caregivers.   ?Sit on chairs with arms. Use the chair arms to help push yourself up when arising.  ?Arrange for the use of a toilet seat elevator so you are not sitting low.  ?Walk with walker as instructed.  ?You may resume a sexual relationship in one month or when given the OK by your caregiver.  ?Use walker as long as suggested by your caregivers.  ?You may put full weight on your legs and walk as much as is comfortable. ?Avoid periods of inactivity such as sitting longer than an hour when not asleep. This helps prevent blood  clots.  ?You may return to work once you are cleared by your surgeon.  ?Do not drive a car for 6 weeks or until released by your surgeon.  ?Do not drive while taking narcotics.  ?Wear elastic stockings for two weeks following surgery during the day but you may remove then at night.  ?Make sure you keep all of your appointments after your operation with all of your doctors and caregivers. You should call the office at the above phone number and make an appointment for approximately two weeks after the date of your surgery. ?Please pick up a stool softener and laxative for home use as long as you are requiring pain medications. ?ICE to the affected hip every three hours for 30 minutes at a time and then as needed for pain and swelling. Continue to use ice on the hip for pain and swelling from surgery. You may notice swelling that will progress down to the foot and ankle.  This is normal after surgery.  Elevate the leg when you are not up walking on it.   ?It is important for you to complete the blood thinner medication as prescribed by your doctor. ?Continue to use the breathing machine which will help keep your temperature down.  It is common for your temperature to cycle up and down following surgery, especially at night when you are not up moving around and exerting yourself.  The breathing machine keeps your lungs expanded and your temperature down. ? ?RANGE OF MOTION AND STRENGTHENING EXERCISES  ?These exercises are designed to help you   keep full movement of your hip joint. Follow your caregiver's or physical therapist's instructions. Perform all exercises about fifteen times, three times per day or as directed. Exercise both hips, even if you have had only one joint replacement. These exercises can be done on a training (exercise) mat, on the floor, on a table or on a bed. Use whatever works the best and is most comfortable for you. Use music or television while you are exercising so that the exercises are a  pleasant break in your day. This will make your life better with the exercises acting as a break in routine you can look forward to.  ?Lying on your back, slowly slide your foot toward your buttocks, raising your knee up off the floor. Then slowly slide your foot back down until your leg is straight again.  ?Lying on your back spread your legs as far apart as you can without causing discomfort.  ?Lying on your side, raise your upper leg and foot straight up from the floor as far as is comfortable. Slowly lower the leg and repeat.  ?Lying on your back, tighten up the muscle in the front of your thigh (quadriceps muscles). You can do this by keeping your leg straight and trying to raise your heel off the floor. This helps strengthen the largest muscle supporting your knee.  ?Lying on your back, tighten up the muscles of your buttocks both with the legs straight and with the knee bent at a comfortable angle while keeping your heel on the floor.  ? ?SKILLED REHAB INSTRUCTIONS: ?If the patient is transferred to a skilled rehab facility following release from the hospital, a list of the current medications will be sent to the facility for the patient to continue.  When discharged from the skilled rehab facility, please have the facility set up the patient's Home Health Physical Therapy prior to being released. Also, the skilled facility will be responsible for providing the patient with their medications at time of release from the facility to include their pain medication and their blood thinner medication. If the patient is still at the rehab facility at time of the two week follow up appointment, the skilled rehab facility will also need to assist the patient in arranging follow up appointment in our office and any transportation needs. ? ?POST-OPERATIVE OPIOID TAPER INSTRUCTIONS: ?It is important to wean off of your opioid medication as soon as possible. If you do not need pain medication after your surgery it is ok  to stop day one. ?Opioids include: ?Codeine, Hydrocodone(Norco, Vicodin), Oxycodone(Percocet, oxycontin) and hydromorphone amongst others.  ?Long term and even short term use of opiods can cause: ?Increased pain response ?Dependence ?Constipation ?Depression ?Respiratory depression ?And more.  ?Withdrawal symptoms can include ?Flu like symptoms ?Nausea, vomiting ?And more ?Techniques to manage these symptoms ?Hydrate well ?Eat regular healthy meals ?Stay active ?Use relaxation techniques(deep breathing, meditating, yoga) ?Do Not substitute Alcohol to help with tapering ?If you have been on opioids for less than two weeks and do not have pain than it is ok to stop all together.  ?Plan to wean off of opioids ?This plan should start within one week post op of your joint replacement. ?Maintain the same interval or time between taking each dose and first decrease the dose.  ?Cut the total daily intake of opioids by one tablet each day ?Next start to increase the time between doses. ?The last dose that should be eliminated is the evening dose.  ? ? ?MAKE   SURE YOU:  ?Understand these instructions.  ?Will watch your condition.  ?Will get help right away if you are not doing well or get worse. ? ?Pick up stool softner and laxative for home use following surgery while on pain medications. ?Do not remove your dressing. ?The dressing is waterproof--it is OK to take showers. ?Continue to use ice for pain and swelling after surgery. ?Do not use any lotions or creams on the incision until instructed by your surgeon. ?Total Hip Protocol. ? ?

## 2021-11-25 NOTE — Progress Notes (Signed)
ANTICOAGULATION CONSULT NOTE - Initial Consult  Pharmacy Consult for Coumadin Indication: atrial fibrillation  Allergies  Allergen Reactions   Augmentin [Amoxicillin-Pot Clavulanate] Diarrhea    Patient Measurements: Height: '5\' 6"'$  (167.6 cm) Weight: 80.3 kg (177 lb) IBW/kg (Calculated) : 59.3  Vital Signs: Temp: 98 F (36.7 C) (09/14 0834) Temp Source: Oral (09/14 0834) BP: 139/87 (09/14 0834) Pulse Rate: 77 (09/14 0834)  Labs: Recent Labs    11/25/21 0835  APTT 29  LABPROT 16.4*  INR 1.3*    Estimated Creatinine Clearance: 35.3 mL/min (A) (by C-G formula based on SCr of 1.29 mg/dL (H)).   Medical History: Past Medical History:  Diagnosis Date   Arthritis    right hip   Cancer ([place])    basal cell skin cancer   Cataract bilateral   Chronic kidney disease    GERD (gastroesophageal reflux disease)    Hypertension    OSA (obstructive sleep apnea)    cpap used nightly   Paroxysmal atrial fibrillation ([facility])    Dr. Talmadge Chad   Peripheral neuropathy    bilateral feet   Raynaud's syndrome    Raynaud's syndrome    fingers and toes    Assessment: Active Problem(s): Left hip osteoarthritis>>TAH 9/14  PMH: Left hip osteoarthritis, R hip OA, pre-DM, neuropathy, paresthesia, low back pain, PAF, HTN, BCC, CKD, GERD, OSA, Raynaud's  Significant events: She is to stop warfarin 5 days prior to surgery.   AC/Heme: Coum,PTA for h/o afib.Baseline Hgb 13. INR 1.3 (held 5d prior to surgery 9/14).  - PTA warfarin dose '3mg'$  daily - CHADS2VASC = 5 (no DM, +Ranaud's for vascular dz)  Goal of Therapy:  INR 2-3 Monitor platelets by anticoagulation protocol: Yes   Plan:  Coumadin '5mg'$  po x 1 tonight Daily INR Bridging?   Rifky Lapre S. Alford Highland, PharmD, BCPS Clinical Staff Pharmacist [email or web address] Alford Highland, Adeena Bernabe Stillinger 11/25/2021,10:48 AM

## 2021-11-26 ENCOUNTER — Encounter (HOSPITAL_COMMUNITY): Payer: Self-pay | Admitting: Orthopedic Surgery

## 2021-11-26 DIAGNOSIS — M1612 Unilateral primary osteoarthritis, left hip: Secondary | ICD-10-CM | POA: Diagnosis not present

## 2021-11-26 LAB — CBC
HCT: 30.1 % — ABNORMAL LOW (ref 36.0–46.0)
Hemoglobin: 9.6 g/dL — ABNORMAL LOW (ref 12.0–15.0)
MCH: 29.5 pg (ref 26.0–34.0)
MCHC: 31.9 g/dL (ref 30.0–36.0)
MCV: 92.6 fL (ref 80.0–100.0)
Platelets: 223 10*3/uL (ref 150–400)
RBC: 3.25 MIL/uL — ABNORMAL LOW (ref 3.87–5.11)
RDW: 13.2 % (ref 11.5–15.5)
WBC: 12.4 10*3/uL — ABNORMAL HIGH (ref 4.0–10.5)
nRBC: 0 % (ref 0.0–0.2)

## 2021-11-26 LAB — BASIC METABOLIC PANEL
Anion gap: 4 — ABNORMAL LOW (ref 5–15)
BUN: 20 mg/dL (ref 8–23)
CO2: 27 mmol/L (ref 22–32)
Calcium: 8.3 mg/dL — ABNORMAL LOW (ref 8.9–10.3)
Chloride: 109 mmol/L (ref 98–111)
Creatinine, Ser: 1.11 mg/dL — ABNORMAL HIGH (ref 0.44–1.00)
GFR, Estimated: 49 mL/min — ABNORMAL LOW (ref >60)
Glucose, Bld: 140 mg/dL — ABNORMAL HIGH (ref 70–99)
Potassium: 3.7 mmol/L (ref 3.5–5.1)
Sodium: 140 mmol/L (ref 135–145)

## 2021-11-26 LAB — PROTIME-INR
INR: 1.4 — ABNORMAL HIGH (ref 0.8–1.2)
Prothrombin Time: 17.2 seconds — ABNORMAL HIGH (ref 11.4–15.2)

## 2021-11-26 MED ORDER — WARFARIN SODIUM 5 MG PO TABS
5.0000 mg | ORAL_TABLET | Freq: Once | ORAL | Status: AC
  Administered 2021-11-26: 5 mg via ORAL
  Filled 2021-11-26: qty 1

## 2021-11-26 MED ORDER — SENNA 8.6 MG PO TABS: 2.0000 | ORAL_TABLET | Freq: Every day | ORAL | 0 refills | Status: AC

## 2021-11-26 MED ORDER — POLYETHYLENE GLYCOL 3350 17 G PO PACK: 17.0000 g | PACK | Freq: Every day | ORAL | 0 refills | Status: AC | PRN

## 2021-11-26 MED ORDER — ONDANSETRON HCL 4 MG PO TABS: 4.0000 mg | ORAL_TABLET | Freq: Three times a day (TID) | ORAL | 0 refills | Status: AC | PRN

## 2021-11-26 MED ORDER — DOCUSATE SODIUM 100 MG PO CAPS: 100.0000 mg | ORAL_CAPSULE | Freq: Two times a day (BID) | ORAL | 0 refills | Status: AC

## 2021-11-26 MED ORDER — HYDROCODONE-ACETAMINOPHEN 10-325 MG PO TABS: 0.5000 | ORAL_TABLET | ORAL | 0 refills | Status: DC | PRN

## 2021-11-26 MED ORDER — TRAMADOL HCL 50 MG PO TABS: 50.0000 mg | ORAL_TABLET | Freq: Four times a day (QID) | ORAL | 0 refills | Status: AC | PRN

## 2021-11-26 MED ORDER — ENOXAPARIN SODIUM 40 MG/0.4ML IJ SOSY: 40.0000 mg | PREFILLED_SYRINGE | INTRAMUSCULAR | 0 refills | Status: DC

## 2021-11-26 NOTE — Plan of Care (Signed)
Prior to discharge, notified Naida Sleight (PA for Dr. Lyla Glassing) that patient desiring order for Tramadol rather than stronger pain medications. D/C orders updated by Naida Sleight.   RN verified Coumadin dosing with pharmacy and administered today's dose. Patient and husband educated that next Coumadin dose will be due tomorrow. Educated regarding use of Lovenox to bridge anticoagulation. Patient and husband verbalize understanding to follow up with primary on Monday regarding anticoagulation regimen.  Patient discharged home via private vehicle with husband.   Ivan Anchors, RN. 11/26/21 3:02 PM

## 2021-11-26 NOTE — Progress Notes (Signed)
ANTICOAGULATION CONSULT NOTE - Initial Consult  Pharmacy Consult for Coumadin Indication: atrial fibrillation  Allergies  Allergen Reactions   Augmentin [Amoxicillin-Pot Clavulanate] Diarrhea    Patient Measurements: Height: '5\' 6"'$  (167.6 cm) Weight: 80.3 kg (177 lb) IBW/kg (Calculated) : 59.3  Vital Signs: Temp: 97.6 F (36.4 C) (09/15 0606) Temp Source: Oral (09/15 0606) BP: 94/60 (09/15 0606) Pulse Rate: 66 (09/15 0606)  Labs: Recent Labs    11/25/21 0835 11/25/21 1611 11/26/21 0345  HGB  --  11.5* 9.6*  HCT  --  37.2 30.1*  PLT  --  258 223  APTT 29  --   --   LABPROT 16.4*  --  17.2*  INR 1.3*  --  1.4*  CREATININE  --  1.01* 1.11*     Estimated Creatinine Clearance: 41 mL/min (A) (by C-G formula based on SCr of 1.11 mg/dL (H)).   Medical History: Past Medical History:  Diagnosis Date   Arthritis    right hip   Cancer ([place])    basal cell skin cancer   Cataract bilateral   Chronic kidney disease    GERD (gastroesophageal reflux disease)    Hypertension    OSA (obstructive sleep apnea)    cpap used nightly   Paroxysmal atrial fibrillation ([facility])    Dr. Talmadge Chad   Peripheral neuropathy    bilateral feet   Raynaud's syndrome    Raynaud's syndrome    fingers and toes    Assessment: Active Problem(s): Left hip osteoarthritis>>TAH 9/14  AC/Heme: Coum,PTA for h/o afib.Baseline Hgb 13. INR 1.3 (held 5d prior to surgery 9/14).  - Hgb 13>9.6 post-op, Plts WNL. INR 1.4.  - PTA warfarin dose '3mg'$  daily - CHADS2VASC = 5 (no DM, +Ranaud's for vascular dz)  Goal of Therapy:  INR 2-3 Monitor platelets by anticoagulation protocol: Yes   Plan:  Repeat Coumadin '5mg'$  po x 1 tonight Daily INR Bridging?   Matai Carpenito S. Alford Highland, PharmD, BCPS Clinical Staff Pharmacist [email or web address] Alford Highland, Diamond Jentz Stillinger 11/26/2021,8:38 AM

## 2021-11-26 NOTE — Progress Notes (Signed)
    Subjective:  Patient reports pain as mild.  Denies N/V/CP/SOB/Abd pain. She states she is doing really well and not having much pain. Denies any new tinging or numbness in LE bilaterally.   Objective:   VITALS:   Vitals:   11/25/21 2158 11/25/21 2247 11/26/21 0126 11/26/21 0606  BP: (!) 109/56  (!) 104/59 94/60  Pulse: 80 64 63 66  Resp: '18 18 18 18  '$ Temp: 97.8 F (36.6 C)  (!) 97.5 F (36.4 C) 97.6 F (36.4 C)  TempSrc: Oral   Oral  SpO2: 97% 97% 97% 95%  Weight:      Height:        Patient sitting up in bed eating breakfast with husband at bedside. NAD.  Neurologically intact ABD soft Neurovascular intact Sensation intact distally Intact pulses distally Dorsiflexion/Plantar flexion intact Incision: dressing C/D/I No cellulitis present Compartment soft   Lab Results  Component Value Date   WBC 12.4 (H) 11/26/2021   HGB 9.6 (L) 11/26/2021   HCT 30.1 (L) 11/26/2021   MCV 92.6 11/26/2021   PLT 223 11/26/2021   BMET    Component Value Date/Time   NA 140 11/26/2021 0345   NA 145 (H) 04/23/2015 1246   K 3.7 11/26/2021 0345   CL 109 11/26/2021 0345   CO2 27 11/26/2021 0345   GLUCOSE 140 (H) 11/26/2021 0345   BUN 20 11/26/2021 0345   BUN 18 04/23/2015 1246   CREATININE 1.11 (H) 11/26/2021 0345   CALCIUM 8.3 (L) 11/26/2021 0345   GFRNONAA 49 (L) 11/26/2021 0345     Assessment/Plan: 1 Day Post-Op   Principal Problem:   Primary osteoarthritis of left hip  Hemoglobin stable at 9.6.  Cr slightly elevated today at 1.11 (1.01 yesterday). Continue to push fluids.   WBAT with walker DVT ppx: lovenox bridge to warfarin, SCDs, TEDS PO pain control PT/OT: PT has not seen yet. PT to come by today.  Dispo: D/c once cleared by PT. Patient to follow-up with her PCP Monday for warfarin monitoring.     Charlott Rakes, PA-C 11/26/2021, 8:32 AM   [facility]  [place] [address], [place], [place] [address]

## 2021-11-26 NOTE — Evaluation (Signed)
Physical Therapy Evaluation Patient Details Name: Ebony Mason MRN: [id number] DOB: [birth year] Today's Date: 11/26/2021  History of Present Illness  Pt s/p L THR and with hx of L THR, CKD, PAF, and peripheral neuropathy  Clinical Impression  Pt s/p L THR and presents with decreased L LE strength/ROM and post op pain limiting functional mobility.  Pt should progress to dc home with family assist.     Recommendations for follow up therapy are one component of a multi-disciplinary discharge planning process, led by the attending physician.  Recommendations may be updated based on patient status, additional functional criteria and insurance authorization.  Follow Up Recommendations Follow physician's recommendations for discharge plan and follow up therapies      Assistance Recommended at Discharge Intermittent Supervision/Assistance  Patient can return home with the following  A little help with walking and/or transfers;A little help with bathing/dressing/bathroom;Assistance with cooking/housework;Assist for transportation;Help with stairs or ramp for entrance    Equipment Recommendations Rolling walker (2 wheels)  Recommendations for Other Services       Functional Status Assessment Patient has had a recent decline in their functional status and demonstrates the ability to make significant improvements in function in a reasonable and predictable amount of time.     Precautions / Restrictions Precautions Precautions: Fall Restrictions Weight Bearing Restrictions: No Other Position/Activity Restrictions: WBAT      Mobility  Bed Mobility Overal bed mobility: Needs Assistance Bed Mobility: Supine to Sit     Supine to sit: Min assist     General bed mobility comments: increased time with cues for sequence and min assist to manage L LE    Transfers Overall transfer level: Needs assistance Equipment used: Rolling walker (2 wheels) Transfers: Sit to/from Stand Sit to  Stand: From elevated surface, Min assist           General transfer comment: cues for LE management and use of UEs to self assist    Ambulation/Gait Ambulation/Gait assistance: Min assist Gait Distance (Feet): 38 Feet Assistive device: Rolling walker (2 wheels) Gait Pattern/deviations: Step-to pattern, Step-through pattern, Shuffle, Trunk flexed       General Gait Details: cues for sequence, posture and position from RW;  assist for stabiltiy and RW management  Stairs            Wheelchair Mobility    Modified Rankin (Stroke Patients Only)       Balance Overall balance assessment: Needs assistance Sitting-balance support: No upper extremity supported, Feet supported Sitting balance-Leahy Scale: Good     Standing balance support: Bilateral upper extremity supported Standing balance-Leahy Scale: Poor                               Pertinent Vitals/Pain Pain Assessment Pain Assessment: 0-10 Pain Score: 4  Pain Location: L hip/thigh Pain Descriptors / Indicators:  (stinging) Pain Intervention(s): Limited activity within patient's tolerance, Monitored during session, Premedicated before session, Ice applied    Home Living Family/patient expects to be discharged to:: Private residence Living Arrangements: Spouse/significant other Available Help at Discharge: Family Type of Home: House Home Access: Ramped entrance       Home Layout: One level Home Equipment: Rollator (4 wheels)      Prior Function Prior Level of Function : Independent/Modified Independent                     Hand Dominance  Extremity/Trunk Assessment   Upper Extremity Assessment Upper Extremity Assessment: Overall WFL for tasks assessed    Lower Extremity Assessment Lower Extremity Assessment: LLE deficits/detail LLE Deficits / Details: 2/5 strength at hip wtih AAROM at hip to 80 flex and 10abd       Communication   Communication: No difficulties   Cognition Arousal/Alertness: Awake/alert Behavior During Therapy: WFL for tasks assessed/performed Overall Cognitive Status: Within Functional Limits for tasks assessed                                          General Comments      Exercises Total Joint Exercises Ankle Circles/Pumps: AROM, Both, 15 reps, Supine Quad Sets: AROM, Both, 10 reps, Supine Heel Slides: AAROM, Left, 20 reps, Supine Hip ABduction/ADduction: AAROM, Left, 15 reps, Supine   Assessment/Plan    PT Assessment Patient needs continued PT services  PT Problem List Decreased strength;Decreased range of motion;Decreased activity tolerance;Decreased balance;Decreased mobility;Decreased knowledge of use of DME;Pain       PT Treatment Interventions DME instruction;Gait training;Functional mobility training;Therapeutic activities;Therapeutic exercise;Patient/family education    PT Goals (Current goals can be found in the Care Plan section)  Acute Rehab PT Goals Patient Stated Goal: REgain IND PT Goal Formulation: With patient Time For Goal Achievement: 12/03/21 Potential to Achieve Goals: Good    Frequency 7X/week     Co-evaluation               AM-PAC PT "6 Clicks" Mobility  Outcome Measure Help needed turning from your back to your side while in a flat bed without using bedrails?: A Little Help needed moving from lying on your back to sitting on the side of a flat bed without using bedrails?: A Little Help needed moving to and from a bed to a chair (including a wheelchair)?: A Little Help needed standing up from a chair using your arms (e.g., wheelchair or bedside chair)?: A Little Help needed to walk in hospital room?: A Little Help needed climbing 3-5 steps with a railing? : A Lot 6 Click Score: 17    End of Session Equipment Utilized During Treatment: Gait belt Activity Tolerance: Patient tolerated treatment well Patient left: in chair;with call bell/phone within reach;with  family/visitor present Nurse Communication: Mobility status PT Visit Diagnosis: Unsteadiness on feet (R26.81);Difficulty in walking, not elsewhere classified (R26.2)    Time: 6754-4920 PT Time Calculation (min) (ACUTE ONLY): 31 min   Charges:   PT Evaluation $PT Eval Low Complexity: 1 Low PT Treatments $Therapeutic Exercise: 8-22 mins        Debe Coder PT Acute Rehabilitation Services Pager [phone] Office [phone]   [address] 11/26/2021, 8:58 AM

## 2021-11-26 NOTE — Progress Notes (Signed)
Physical Therapy Treatment Patient Details Name: Ebony Mason MRN: [id number] DOB: [birth year] Today's Date: 11/26/2021   History of Present Illness Pt s/p L THR and with hx of L THR, CKD, PAF, and peripheral neuropathy    PT Comments    Pt continues motivated and progressing steadily with mobility.  Pt up to bathroom for toileting and hygiene standing at sink, ambulated in hall, and reviewed HEP with written instruction provided and reviewed.  Pt and spouse express desire to dc home this date and note a good friend will be assisting as well.  Recommendations for follow up therapy are one component of a multi-disciplinary discharge planning process, led by the attending physician.  Recommendations may be updated based on patient status, additional functional criteria and insurance authorization.  Follow Up Recommendations  Follow physician's recommendations for discharge plan and follow up therapies     Assistance Recommended at Discharge Intermittent Supervision/Assistance  Patient can return home with the following A little help with walking and/or transfers;A little help with bathing/dressing/bathroom;Assistance with cooking/housework;Assist for transportation;Help with stairs or ramp for entrance   Equipment Recommendations  Rolling walker (2 wheels)    Recommendations for Other Services       Precautions / Restrictions Precautions Precautions: Fall Restrictions Weight Bearing Restrictions: No Other Position/Activity Restrictions: WBAT     Mobility  Bed Mobility Overal bed mobility: Needs Assistance Bed Mobility: Sit to Supine     Supine to sit: Min assist Sit to supine: Min assist   General bed mobility comments: increased time with cues for sequence and min assist to manage L LE    Transfers Overall transfer level: Needs assistance Equipment used: Rolling walker (2 wheels) Transfers: Sit to/from Stand Sit to Stand: Min guard           General transfer  comment: cues for LE management and use of UEs to self assist    Ambulation/Gait Ambulation/Gait assistance: Min assist, Min guard Gait Distance (Feet): 65 Feet (and 15' into bathroom) Assistive device: Rolling walker (2 wheels) Gait Pattern/deviations: Step-to pattern, Step-through pattern, Shuffle, Trunk flexed Gait velocity: decr     General Gait Details: cues for sequence, posture and position from RW;  assist for stabilty and RW management   Stairs             Wheelchair Mobility    Modified Rankin (Stroke Patients Only)       Balance Overall balance assessment: Needs assistance Sitting-balance support: No upper extremity supported, Feet supported Sitting balance-Leahy Scale: Good     Standing balance support: No upper extremity supported Standing balance-Leahy Scale: Fair                              Cognition Arousal/Alertness: Awake/alert Behavior During Therapy: WFL for tasks assessed/performed Overall Cognitive Status: Within Functional Limits for tasks assessed                                          Exercises Total Joint Exercises Ankle Circles/Pumps: AROM, Both, 15 reps, Supine Quad Sets: AROM, Both, Supine, 5 reps Heel Slides: AAROM, Left, Supine, 10 reps Hip ABduction/ADduction: AAROM, Left, Supine, 10 reps    General Comments        Pertinent Vitals/Pain Pain Assessment Pain Assessment: 0-10 Pain Score: 4  Pain Location: L hip/thigh Pain Descriptors / Indicators:  (  stinging) Pain Intervention(s): Limited activity within patient's tolerance, Monitored during session, Premedicated before session    Home Living                          Prior Function            PT Goals (current goals can now be found in the care plan section) Acute Rehab PT Goals Patient Stated Goal: REgain IND PT Goal Formulation: With patient Time For Goal Achievement: 12/03/21 Potential to Achieve Goals:  Good Progress towards PT goals: Progressing toward goals    Frequency    7X/week      PT Plan Current plan remains appropriate    Co-evaluation              AM-PAC PT "6 Clicks" Mobility   Outcome Measure  Help needed turning from your back to your side while in a flat bed without using bedrails?: A Little Help needed moving from lying on your back to sitting on the side of a flat bed without using bedrails?: A Little Help needed moving to and from a bed to a chair (including a wheelchair)?: A Little Help needed standing up from a chair using your arms (e.g., wheelchair or bedside chair)?: A Little Help needed to walk in hospital room?: A Little Help needed climbing 3-5 steps with a railing? : A Lot 6 Click Score: 17    End of Session Equipment Utilized During Treatment: Gait belt Activity Tolerance: Patient tolerated treatment well Patient left: with call bell/phone within reach;with family/visitor present;in bed Nurse Communication: Mobility status PT Visit Diagnosis: Unsteadiness on feet (R26.81);Difficulty in walking, not elsewhere classified (R26.2)     Time: 1062-6948 PT Time Calculation (min) (ACUTE ONLY): 42 min  Charges:  $Gait Training: 8-22 mins $Therapeutic Exercise: 8-22 mins $Therapeutic Activity: 8-22 mins                     Debe Coder PT Acute Rehabilitation Services Pager [phone] Office [phone]    [name] 11/26/2021, 1:09 PM

## 2021-11-26 NOTE — Plan of Care (Signed)

## 2023-07-10 ENCOUNTER — Encounter: Payer: Self-pay | Admitting: Gastroenterology

## 2023-08-31 NOTE — Progress Notes (Signed)
 Chief Complaint: chronic diarrhea Primary GI Doctor:***  HPI:  Patient is a 83 year old female/female patient with past medical history ofn GERD, hypertension, basal cell cancer, A-fib*****who was referred to me by Melvin, Jon K, MD on 06/15/2023 for a complaint of chronic diarrhea.    Interval History  Patient admits/denies GERD Patient admits/denies dysphagia Patient admits/denies nausea, vomiting, or weight loss  Patient admits/denies altered bowel habits Patient admits/denies abdominal pain Patient admits/denies rectal bleeding   Denies/Admits alcohol  Denies/Admits smoking Denies/Admits NSAID use. Denies/Admits they are on blood thinners. Patient taking Coumadin   Patients last colonoscopy Patients last EGD  Patient's family history includes  Wt Readings from Last 3 Encounters:  11/25/21 177 lb (80.3 kg)  11/12/21 177 lb (80.3 kg)  12/31/15 213 lb (96.6 kg)      Past Medical History:  Diagnosis Date   Arthritis    right hip   Cancer (HCC)    basal cell skin cancer   Cataract bilateral   Chronic kidney disease    GERD (gastroesophageal reflux disease)    Hypertension    OSA (obstructive sleep apnea)    cpap used nightly   Paroxysmal atrial fibrillation (HCC)    Dr. Westley Hammers   Peripheral neuropathy    bilateral feet   Raynaud's syndrome    Raynaud's syndrome    fingers and toes    Past Surgical History:  Procedure Laterality Date   ABDOMINAL HYSTERECTOMY     CATARACT EXTRACTION, BILATERAL     LOWER EXTREMITY VENOUS DOPPLER  11/18/2010   No evidence of thrombus or thrombophlebitis. No venous insufficiency was noted.   TOTAL HIP ARTHROPLASTY Right 12/31/2015   Procedure: RIGHT TOTAL HIP ARTHROPLASTY ANTERIOR APPROACH;  Surgeon: Adonica Hoose, MD;  Location: [facility];  Service: Orthopedics;  Laterality: Right;  Needs RNFA   TOTAL HIP ARTHROPLASTY Left 11/25/2021   Procedure: TOTAL HIP ARTHROPLASTY ANTERIOR APPROACH;  Surgeon: Adonica Hoose, MD;   Location: [facility];  Service: Orthopedics;  Laterality: Left;    Current Outpatient Medications  Medication Sig Dispense Refill   cholecalciferol  (VITAMIN D3) 25 MCG (1000 UNIT) tablet Take 1,000 Units by mouth daily.     clobetasol cream (TEMOVATE) 0.05 % Apply 1 Application topically daily as needed for rash.     cyanocobalamin (VITAMIN B12) 1000 MCG/ML injection Inject 1,000 mcg into the skin every 30 (thirty) days.     diclofenac Sodium (VOLTAREN) 1 % GEL Apply 2 g topically daily as needed (pain).     enoxaparin  (LOVENOX ) 40 MG/0.4ML injection Inject 0.4 mLs (40 mg total) into the skin daily. 6 mL 0   febuxostat  (ULORIC ) 40 MG tablet Take 40 mg by mouth daily.     fluticasone  (FLONASE ) 50 MCG/ACT nasal spray Place 2 sprays into both nostrils daily as needed for allergies.     gabapentin  (NEURONTIN ) 600 MG tablet Take 600 mg by mouth in the morning, at noon, in the evening, and at bedtime.     lovastatin (MEVACOR) 40 MG tablet Take 40 mg by mouth daily.  3   metoprolol  succinate (TOPROL -XL) 100 MG 24 hr tablet Take 100 mg by mouth at bedtime.      NON FORMULARY Pt uses a cpap nightly     oxybutynin  (DITROPAN -XL) 5 MG 24 hr tablet Take 5 mg by mouth daily.     OXYGEN Inhale 2 L into the lungs at bedtime.     pantoprazole  (PROTONIX ) 40 MG tablet Take 40 mg by mouth daily.  triamterene -hydrochlorothiazide  (MAXZIDE -25) 37.5-25 MG per tablet Take 1 tablet by mouth daily.     warfarin (COUMADIN ) 3 MG tablet Take 3 mg by mouth daily.     No current facility-administered medications for this visit.    Allergies as of 09/01/2023 - Review Complete 11/25/2021  Allergen Reaction Noted   Augmentin [amoxicillin-pot clavulanate] Diarrhea 11/25/2021    Family History  Problem Relation Age of Onset   Liver cancer Mother    Alzheimer's disease Father    Diabetes Maternal Grandfather     Review of Systems:    Constitutional: No weight loss, fever, chills, weakness or fatigue HEENT: Eyes: No  change in vision               Ears, Nose, Throat:  No change in hearing or congestion Skin: No rash or itching Cardiovascular: No chest pain, chest pressure or palpitations   Respiratory: No SOB or cough Gastrointestinal: See HPI and otherwise negative Genitourinary: No dysuria or change in urinary frequency Neurological: No headache, dizziness or syncope Musculoskeletal: No new muscle or joint pain Hematologic: No bleeding or bruising Psychiatric: No history of depression or anxiety    Physical Exam:  Vital signs: There were no vitals taken for this visit.  Constitutional:   Pleasant *** female appears to be in NAD, Well developed, Well nourished, alert and cooperative Head:  Normocephalic and atraumatic. Eyes:   PEERL, EOMI. No icterus. Conjunctiva pink. Ears:  Normal auditory acuity. Neck:  Supple Throat: Oral cavity and pharynx without inflammation, swelling or lesion.  Respiratory: Respirations even and unlabored. Lungs clear to auscultation bilaterally.   No wheezes, crackles, or rhonchi.  Cardiovascular: Normal S1, S2. Regular rate and rhythm. No peripheral edema, cyanosis or pallor.  Gastrointestinal:  Soft, nondistended, nontender. No rebound or guarding. Normal bowel sounds. No appreciable masses or hepatomegaly. Rectal:  Not performed.  Anoscopy: Msk:  Symmetrical without gross deformities. Without edema, no deformity or joint abnormality.  Neurologic:  Alert and  oriented x4;  grossly normal neurologically.  Skin:   Dry and intact without significant lesions or rashes. Psychiatric: Oriented to person, place and time. Demonstrates good judgement and reason without abnormal affect or behaviors.  RELEVANT LABS AND IMAGING: CBC    Latest Ref Rng & Units 11/26/2021    3:45 AM 11/25/2021    4:11 PM 11/12/2021   11:37 AM  CBC  WBC 4.0 - 10.5 K/uL 12.4  15.0  8.9   Hemoglobin 12.0 - 15.0 g/dL 9.6  16.1  09.6   Hematocrit 36.0 - 46.0 % 30.1  37.2  41.8   Platelets 150 -  400 K/uL 223  258  326      CMP     Latest Ref Rng & Units 11/26/2021    3:45 AM 11/25/2021    4:11 PM 11/12/2021   11:37 AM  CMP  Glucose 70 - 99 mg/dL 045   93   BUN 8 - 23 mg/dL 20   22   Creatinine 4.09 - 1.00 mg/dL 8.11  9.14  7.82   Sodium 135 - 145 mmol/L 140   140   Potassium 3.5 - 5.1 mmol/L 3.7   4.3   Chloride 98 - 111 mmol/L 109   105   CO2 22 - 32 mmol/L 27   29   Calcium 8.9 - 10.3 mg/dL 8.3   9.6      Lab Results  Component Value Date   TSH 4.200 04/23/2015     Assessment: 1. ***  Plan: 1. ***   Thank you for the courtesy of this consult. Please call me with any questions or concerns.   Leonel Mccollum, FNP-C [facility] 08/31/2023, 4:59 PM  Cc: Melvin, Jon K, MD

## 2023-09-01 ENCOUNTER — Encounter: Payer: Self-pay | Admitting: Gastroenterology

## 2023-09-01 ENCOUNTER — Ambulatory Visit
Admission: RE | Admit: 2023-09-01 | Discharge: 2023-09-01 | Disposition: A | Discharge status: Home / Self Care | Source: Ambulatory Visit | Attending: Gastroenterology | Admitting: Gastroenterology

## 2023-09-01 ENCOUNTER — Ambulatory Visit: Admitting: Gastroenterology

## 2023-09-01 VITALS — BP 128/68 | HR 85 | Ht 66.0 in | Wt 189.1 lb

## 2023-09-01 DIAGNOSIS — R159 Full incontinence of feces: Secondary | ICD-10-CM

## 2023-09-01 DIAGNOSIS — R152 Fecal urgency: Secondary | ICD-10-CM

## 2023-09-01 DIAGNOSIS — R194 Change in bowel habit: Secondary | ICD-10-CM | POA: Diagnosis not present

## 2023-09-01 DIAGNOSIS — K5900 Constipation, unspecified: Secondary | ICD-10-CM

## 2023-09-01 NOTE — Patient Instructions (Addendum)
 Recommend High fiber diet No straining or pushing during bowel movements Start psyllium husk 1 tsp po daily , can increase 2 tsp po daily  May consider pelvic floor therapy if no improvement   Your provider has requested that you have an abdominal x ray before leaving today. Please go to the basement floor to our Radiology department for the test.   _______________________________________________________  If your blood pressure at your visit was 140/90 or greater, please contact your primary care physician to follow up on this.  _______________________________________________________  If you are age 85 or older, your body mass index should be between 23-30. Your Body mass index is 30.53 kg/m. If this is out of the aforementioned range listed, please consider follow up with your Primary Care Provider.  If you are age 72 or younger, your body mass index should be between 19-25. Your Body mass index is 30.53 kg/m. If this is out of the aformentioned range listed, please consider follow up with your Primary Care Provider.   ________________________________________________________  The [facility] providers would like to encourage you to use MYCHART to communicate with providers for non-urgent requests or questions.  Due to long hold times on the telephone, sending your provider a message by [facility] may be a faster and more efficient way to get a response.  Please allow 48 business hours for a response.  Please remember that this is for non-urgent requests.  _______________________________________________________   It was a pleasure to see you today!  Thank you for trusting me with your gastrointestinal care!

## 2023-09-11 ENCOUNTER — Ambulatory Visit: Payer: Self-pay | Admitting: Gastroenterology

## 2023-09-11 NOTE — Telephone Encounter (Signed)
-----   Message from Cathryne PARAS May sent at 09/11/2023  8:51 AM EDT ----- Karna- Let the patient know xray shows small amount of stool in the colon. No bowel obstruction.  Cathryne, NP ----- Message ----- From: Interface, Rad Results In Sent: 09/10/2023  10:52 PM EDT To: Cathryne PARAS May, NP

## 2023-10-13 ENCOUNTER — Ambulatory Visit: Admitting: Gastroenterology

## 2023-10-27 NOTE — ED Provider Notes (Signed)
 [facility] Emergency Department Provider Note    ED Clinical Impression    Final diagnoses:  Bleeding hemorrhoids (Primary)     Initial Impression, Medical Decision Making, Progress Notes and Critical Care    Ebony Mason is a 83 y.o. female with history of atrial fibrillation on eliquis who presents with rectal bleeding. She is accompanied by her husband.   Triage VS within normal limits.  History and exam consistent with bleeding internal hemorrhoids.  Considered other sources of bleeding including diverticular bleeding, ectasias, colorectal polyps.  Given patient's age colorectal cancer is also on the differential.  As she is asymptomatic and has no pain, weight loss, fevers, will treat for hemorrhoids.  Prescribed phenylephrine  ointment.  Other symptomatic management discussed.  Discussed close observation of her symptoms, and follow-up if she develops new symptoms.  PCP follow-up.  ED return precautions discussed.  Patient be discharged in good condition.    Portions of this record have been created using Scientist, clinical (histocompatibility and immunogenetics). Dictation errors have been sought, but may not have been identified and corrected.  See chart documentation for details. ____________________________________________      History  Reason for Visit Hemorrhoids   History of Present Illness Ebony Mason is an 83 year old female with atrial fibrillation who presents with rectal bleeding. She is accompanied by her husband.  She began experiencing rectal bleeding this morning, noticing blood on her nightgown while getting dressed. The estimated amount was a several tablespoons. There is no associated pain, including abdominal or rectal pain, and the bleeding seems to have stopped.  Her last bowel movement was last night, which she describes as not normal due to occasional incontinence, but she denies constipation. She has a history hemorrhoids and has only had small episodes of  bleeding in the past.  States that at present the hemorrhoid does not really bother her, maybe just a little irritated.  States that at most she had maybe a tablespoon of blood in the past, much less than today.  No recent vomiting, fever, or weight loss. She reports eating and drinking normally. She has not experienced lightheadedness, dizziness, shortness of breath, or chest pain.  She has a history of atrial fibrillation and has undergone colonoscopies in the past, though she is unsure of the timing of her last procedure.     Review of Systems  See HPI, 10 pt systems reviewed and otherwise negative.   Past Medical History[1]  Problem List[2]  Past Surgical History[3]  No current facility-administered medications for this encounter.  Current Outpatient Medications:  .  apixaban (ELIQUIS) 2.5 mg Tab, Take 1 tablet (2.5 mg total) by mouth two (2) times a day., Disp: 180 tablet, Rfl: 3 .  cholecalciferol , vitamin D3 25 mcg, 1,000 units,, 1,000 unit (25 mcg) tablet, Take 2 tablets (50 mcg total) by mouth daily., Disp: , Rfl:  .  clobetasol (TEMOVATE) 0.05 % cream, APPLY THIN LAYER TO AFFECTED AREA TWICE DAILY X 1 MONTH, Disp: , Rfl: 1 .  cyanocobalamin, vitamin B-12, 1,000 mcg/mL injection, INJECT 1 ML (1,000 MCG TOTAL) UNDER THE SKIN EVERY THIRTY (30) DAYS., Disp: 3 mL, Rfl: 3 .  diclofenac sodium (VOLTAREN) 1 % gel, Apply 2 g topically daily as needed., Disp: , Rfl:  .  febuxostat  (ULORIC ) 40 mg tablet, TAKE ONE TABLET BY MOUTH EVERY MORNING, Disp: 90 tablet, Rfl: 0 .  fluticasone  propionate (FLONASE ) 50 mcg/actuation nasal spray, 2 sprays into each nostril daily., Disp: 16 mL, Rfl: 11 .  gabapentin  (  NEURONTIN ) 600 MG tablet, Take 1 tablet (600 mg total) by mouth four (4) times a day., Disp: 360 tablet, Rfl: 3 .  lovastatin (MEVACOR) 40 MG tablet, Take 1 tablet (40 mg total) by mouth nightly., Disp: 90 tablet, Rfl: 3 .  metoPROLOL  succinate (TOPROL -XL) 100 MG 24 hr tablet, TAKE 1 TABLET  BY MOUTH EVERY DAY, Disp: 90 tablet, Rfl: 3 .  miscellaneous medical supply Misc, Heated tubing for CPAP machine Dx: OSA, Disp: 1 each, Rfl: 0 .  oxybutynin  (DITROPAN ) 5 MG tablet, TAKE 1 TABLET BY MOUTH TWICE A DAY, Disp: 180 tablet, Rfl: 3 .  pantoprazole  (PROTONIX ) 40 MG tablet, TAKE 1 TABLET BY MOUTH EVERY DAY, Disp: 90 tablet, Rfl: 1 .  phenyleph-min oil-petrolatum (PREPARATION H) 0.25-14-74.9 % Oint, Apply 1 Application topically every six (6) hours as needed for up to 7 days., Disp: 28 g, Rfl: 0 .  polyethylene glycol (MIRALAX ) 17 gram packet, Take 17 g by mouth daily., Disp: 30 packet, Rfl: 11 .  traMADol  (ULTRAM ) 50 mg tablet, Take 1 tablet (50 mg total) by mouth every six (6) hours as needed for pain., Disp: 20 tablet, Rfl: 0 .  triamcinolone (KENALOG) 0.1 % cream, Apply topically as needed., Disp: , Rfl:  .  triamterene -hydroCHLOROthiazide  (MAXZIDE -25) 37.5-25 mg per tablet, TAKE 1 TABLET BY MOUTH EVERY DAY, Disp: 90 tablet, Rfl: 3  Allergies Augmentin [amoxicillin-pot clavulanate]  Family History[4]  Social History Short Social History[5]     Physical Exam    ED Triage Vitals [10/27/23 1000]  Enc Vitals Group     BP 121/66     Pulse 62     SpO2 Pulse 62     Resp 18     Temp 36.9 C (98.4 F)     Temp Source Temporal     SpO2 98 %     Weight 81.6 kg (180 lb)    Constitutional: Alert and oriented. Well appearing and in no distress. Eyes: Conjunctivae are normal. ENT      Head: Normocephalic and atraumatic. Cardiovascular: Normal rate, regular rhythm.  Respiratory: Normal respiratory effort. Breath sounds are normal. Gastrointestinal: Soft and nontender.  Rectal exam: Performed with chaperone, Wynona Gaskins, NA Several external hemorrhoids.  No obvious active rectal bleeding.  There is some dried blood around the rectum. Musculoskeletal: Normal range of motion in all extremities.      Right lower leg: No tenderness or edema.      Left lower leg: No tenderness or  edema. Neurologic: Normal speech and language. No gross focal neurologic deficits are appreciated. Skin: Skin is warm, dry and intact. No rash noted. Psychiatric: Mood and affect are normal. Speech and behavior are normal.    Laboratory Studies   No results found for this visit on 10/27/23.    Radiology   No orders to display      MDM   Discussion with other professionals: None Independent interpretation: None I have reviewed recent and relavant previous record, including: None Escalation of Care including OBS/Admission/Transfer was considered: None, patient safe for discharge home Social Determinants that significantly affected care: None Prescription drugs considered but not prescribed: None Diagnostic tests considered but not performed: None       [1] Past Medical History: Diagnosis Date  . Abdominal pain 10/05/2022  . Acute bacterial rhinosinusitis 03/25/2022  . Ankle instability, right 03/10/2017  . Arthritis    rt hip  . Atrial fibrillation       . Atypical chest pain 09/01/2012  .  Bronchitis   . Cancer       . Chronic pain of both ankles 02/23/2017  . Diarrhea 08/03/2021  . Dizziness 09/18/2022  . Elevated CK 02/23/2017  . GERD (gastroesophageal reflux disease)   . Globus sensation 05/21/2018  . Hemorrhage of tongue 04/05/2021  . Hepatomegaly 08/05/2013  . Hyperlipidemia   . Hypertension   . Neuropathic pain   . Plantar fasciitis of right foot 04/17/2017  . Right shoulder injury   . Sleep apnea   . Sleep apnea, obstructive   . 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 Strain of left triceps 02/08/2022  . Subacute cough 02/23/2017  . Thyroid  dysfunction 03/09/2017  [2] Patient Active Problem List Diagnosis  . Chronic atrial fibrillation      . Benign essential HTN  . Hyperlipidemia  . GERD (gastroesophageal reflux disease)  . Benign mammary dysplasia  . Peripheral vascular disease  . OSA (obstructive sleep apnea)  . Idiopathic  neuropathy  . Raynaud's disease without gangrene  . Spinal stenosis of lumbosacral region  . Stage 3b chronic kidney disease (CMS-HCC)  . Chronic gout of multiple sites  . B12 deficiency  . Postnasal drip  . Vitamin D  deficiency  . Healthcare maintenance  . Bilateral lower extremity edema  . Valvular heart disease  . Chronic heart failure with preserved ejection fraction      . Incontinence of feces  [3] Past Surgical History: Procedure Laterality Date  . HYSTERECTOMY    . JOINT REPLACEMENT     Right Hip- 2017; Left Hip- 2023  . OOPHORECTOMY    . PR CATH PLACE/CORON ANGIO, IMG SUPER/INTERP,W LEFT HEART VENTRICULOGRAPHY N/A 12/01/2015   Procedure: Left Heart Catheterization;  Surgeon: Suella Finders, MD;  Location: [facility] CATH;  Service: Cardiology  . PR XCAPSL CTRC RMVL INSJ IO LENS PROSTH W/O ECP Right 05/25/2015   Procedure: CATARACT EXT WITH IOL IMPLANT - RIGHT;  Surgeon: Alm Danas, MD;  Location: [facility];  Service: Ophthalmology  . PR XCAPSL CTRC RMVL INSJ IO LENS PROSTH W/O ECP Left 06/08/2015   Procedure: CATARACT EXT WITH IOL IMPLANT - LEFT;  Surgeon: Alm Danas, MD;  Location: [facility];  Service: Ophthalmology  [4] Family History Problem Relation Age of Onset  . Breast cancer Paternal Aunt   . Cancer Mother        liver  . Mental illness Father        alzheimers  . Heart disease Maternal Uncle        died from MI  . Diabetes Cousin        died from DM  . Diabetes Maternal Grandfather   . Heart disease Paternal Grandfather   . Drug abuse Neg Hx   [5] Social History Tobacco Use  . Smoking status: Never    Passive exposure: Never  . Smokeless tobacco: Never  Vaping Use  . Vaping status: Never Used  Substance Use Topics  . Alcohol  use: No    Alcohol /week: 0.0 standard drinks of alcohol   . Drug use: No   Munden, Alexandra Rae, [place] 10/27/23 1523

## 2023-11-09 ENCOUNTER — Encounter: Payer: Self-pay | Admitting: Gastroenterology

## 2023-11-09 ENCOUNTER — Ambulatory Visit (INDEPENDENT_AMBULATORY_CARE_PROVIDER_SITE_OTHER): Admitting: Gastroenterology

## 2023-11-09 VITALS — BP 124/86 | HR 59 | Ht 66.0 in | Wt 187.8 lb

## 2023-11-09 DIAGNOSIS — R159 Full incontinence of feces: Secondary | ICD-10-CM | POA: Diagnosis not present

## 2023-11-09 DIAGNOSIS — R152 Fecal urgency: Secondary | ICD-10-CM

## 2023-11-09 DIAGNOSIS — K648 Other hemorrhoids: Secondary | ICD-10-CM | POA: Diagnosis not present

## 2023-11-09 DIAGNOSIS — R194 Change in bowel habit: Secondary | ICD-10-CM

## 2023-11-09 NOTE — Progress Notes (Signed)
 Chief Complaint: follow up on chronic diarrhea Primary GI Doctor: Dr. Charlanne  HPI:  Patient is a 83 year old female patient with past medical history ofn GERD, hypertension, basal cell cancer, A-fib, who was referred to me by Seena Thom POUR, MD on 06/15/2023 for a complaint of chronic diarrhea.   Last seen by myself on 09/01/23 for chronic diarrhea.  10/27/23 ED visit for rectal bleeding. Exam showed external hemorrhoids. Prescribed phenylephrine  ointment. No further workup.  Interval History     Patient presents for follow-up on chronic diarrhea for the past year. At our initial appointment we ordered abdominal xray to rule out obstipation and instructed patient to start psyllium husk.  Patient reports she got the psyllium husk powder but couldn't tolerate it so she recently switched to pills. She was not sure of the dosage and has taken 2 to 3 capsules for the last few days. She has had 3-4 episodes where she almost had accident.  No abd pain or bloating.  She also recently had issues with rectal bleeding from external hemorrhoids and went to ED where they prescribed topical ointment, which she states worked right away and stopped the bleeding. We discussed surgical options, she is not interested at this time.  She reports last colonoscopy was over 15 years ago and normal.   Patient taking Coumadin  for afib.   Patient's family history includes mother with liver CA.  Wt Readings from Last 3 Encounters:  09/01/23 189 lb 2 oz (85.8 kg)  11/25/21 177 lb (80.3 kg)  11/12/21 177 lb (80.3 kg)    Past Medical History:  Diagnosis Date   Arthritis    right hip   Cancer (HCC)    basal cell skin cancer   Cataract bilateral   Chronic kidney disease    GERD (gastroesophageal reflux disease)    Hypertension    OSA (obstructive sleep apnea)    cpap used nightly   Paroxysmal atrial fibrillation (HCC)    Dr. Von liming   Peripheral neuropathy    bilateral feet   Raynaud's syndrome     Raynaud's syndrome    fingers and toes    Past Surgical History:  Procedure Laterality Date   ABDOMINAL HYSTERECTOMY     CATARACT EXTRACTION, BILATERAL     LOWER EXTREMITY VENOUS DOPPLER  11/18/2010   No evidence of thrombus or thrombophlebitis. No venous insufficiency was noted.   TOTAL HIP ARTHROPLASTY Right 12/31/2015   Procedure: RIGHT TOTAL HIP ARTHROPLASTY ANTERIOR APPROACH;  Surgeon: Redell Shoals, MD;  Location: [facility];  Service: Orthopedics;  Laterality: Right;  Needs RNFA   TOTAL HIP ARTHROPLASTY Left 11/25/2021   Procedure: TOTAL HIP ARTHROPLASTY ANTERIOR APPROACH;  Surgeon: Shoals Redell, MD;  Location: [facility];  Service: Orthopedics;  Laterality: Left;    Current Outpatient Medications  Medication Sig Dispense Refill   cholecalciferol  (VITAMIN D3) 25 MCG (1000 UNIT) tablet Take 1,000 Units by mouth daily.     clobetasol cream (TEMOVATE) 0.05 % Apply 1 Application topically daily as needed for rash.     cyanocobalamin (VITAMIN B12) 1000 MCG/ML injection Inject 1,000 mcg into the skin every 30 (thirty) days.     diclofenac Sodium (VOLTAREN) 1 % GEL Apply 2 g topically daily as needed (pain).     ELIQUIS 2.5 MG TABS tablet Take 2.5 mg by mouth 2 (two) times daily.     enoxaparin  (LOVENOX ) 40 MG/0.4ML injection Inject 0.4 mLs (40 mg total) into the skin daily. 6 mL 0  febuxostat  (ULORIC ) 40 MG tablet Take 40 mg by mouth daily.     fluticasone  (FLONASE ) 50 MCG/ACT nasal spray Place 2 sprays into both nostrils daily as needed for allergies.     gabapentin  (NEURONTIN ) 600 MG tablet Take 600 mg by mouth in the morning, at noon, in the evening, and at bedtime.     lovastatin (MEVACOR) 40 MG tablet Take 40 mg by mouth daily.  3   metoprolol  succinate (TOPROL -XL) 100 MG 24 hr tablet Take 100 mg by mouth at bedtime.      NON FORMULARY Pt uses a cpap nightly     oxybutynin  (DITROPAN -XL) 5 MG 24 hr tablet Take 5 mg by mouth daily.     OXYGEN Inhale 2 L into the lungs at bedtime.      pantoprazole  (PROTONIX ) 40 MG tablet Take 40 mg by mouth daily.     triamterene -hydrochlorothiazide  (MAXZIDE -25) 37.5-25 MG per tablet Take 1 tablet by mouth daily.     No current facility-administered medications for this visit.    Allergies as of 11/09/2023 - Review Complete 09/01/2023  Allergen Reaction Noted   Augmentin [amoxicillin-pot clavulanate] Diarrhea 11/25/2021    Family History  Problem Relation Age of Onset   Liver cancer Mother    Alzheimer's disease Father    Diabetes Sister    Diabetes Maternal Grandfather     Review of Systems:    Constitutional: No weight loss, fever, chills, weakness or fatigue HEENT: Eyes: No change in vision               Ears, Nose, Throat:  No change in hearing or congestion Skin: No rash or itching Cardiovascular: No chest pain, chest pressure or palpitations   Respiratory: No SOB or cough Gastrointestinal: See HPI and otherwise negative Genitourinary: No dysuria or change in urinary frequency Neurological: No headache, dizziness or syncope Musculoskeletal: No new muscle or joint pain Hematologic: No bleeding or bruising Psychiatric: No history of depression or anxiety    Physical Exam:  Vital signs: There were no vitals taken for this visit.  Constitutional:  Pleasant  female appears to be in NAD, Well developed, Well nourished, alert and cooperative Throat: Oral cavity and pharynx without inflammation, swelling or lesion.  Respiratory: Respirations even and unlabored. Lungs clear to auscultation bilaterally.   No wheezes, crackles, or rhonchi.  Cardiovascular: Normal S1, S2. Regular rate and rhythm. No peripheral edema, cyanosis or pallor.  Gastrointestinal:  Soft, nondistended, nontender. No rebound or guarding. Normal bowel sounds. No appreciable masses or hepatomegaly. Rectal:  Not performed.  Msk:  Symmetrical without gross deformities. Without edema, no deformity or joint abnormality.  Neurologic:  Alert and  oriented x4;   grossly normal neurologically.  Skin:   Dry and intact without significant lesions or rashes. Psychiatric: Oriented to person, place and time. Demonstrates good judgement and reason without abnormal affect or behaviors.  RELEVANT LABS AND IMAGING: CBC    Latest Ref Rng & Units 11/26/2021    3:45 AM 11/25/2021    4:11 PM 11/12/2021   11:37 AM  CBC  WBC 4.0 - 10.5 K/uL 12.4  15.0  8.9   Hemoglobin 12.0 - 15.0 g/dL 9.6  88.4  86.9   Hematocrit 36.0 - 46.0 % 30.1  37.2  41.8   Platelets 150 - 400 K/uL 223  258  326      CMP     Latest Ref Rng & Units 11/26/2021    3:45 AM 11/25/2021    4:11  PM 11/12/2021   11:37 AM  CMP  Glucose 70 - 99 mg/dL 859   93   BUN 8 - 23 mg/dL 20   22   Creatinine 9.55 - 1.00 mg/dL 8.88  8.98  8.70   Sodium 135 - 145 mmol/L 140   140   Potassium 3.5 - 5.1 mmol/L 3.7   4.3   Chloride 98 - 111 mmol/L 109   105   CO2 22 - 32 mmol/L 27   29   Calcium 8.9 - 10.3 mg/dL 8.3   9.6      Lab Results  Component Value Date   TSH 4.200 04/23/2015    09/01/23 Abd xray 2 view- IMPRESSION: Small volume of formed stool in the colon. No bowel obstruction.   Assessment: Encounter Diagnoses  Name Primary?   Altered bowel habits Yes   Incontinence of feces with fecal urgency    Internal hemorrhoids      83 year old female patient presents for follow-up for chronic diarrhea. Abd xray 2 view showed small volume of stool. R/o obstipation.  Patient was unable to tolerate powdered form of psyllium husk and recently switched to capsules.  I will have her take 3 capsules/day for the next 1 to 2 weeks and slowly increase to max of 5/day to see if this helps soak up some of the looseness in the stool.  Patient also follows high-fiber diet.  We did discuss she could use over-the-counter Imodium half to full tablet as needed as needed.  Patient has concerns about it causing constipation therefore we decided to only use if she is going out of the house or special occasions.  We also  revisited pelvic floor therapy however she states that she does not believe it is covered with her insurance.    For patient's external hemorrhoids we discussed using the topical ointment as needed.  Patient reminded what symptoms to report and or if the bleeding reoccurs.  We discussed surgical options however patient declines at this time.  Plan: - Continue High fiber diet -No straining or pushing during bowel movements -use Anusol ointment prn hemorrhoids - we discussed surgical options, patient declined  -Continue psyllium husk 3 capsules daily, will increase if no improvement -OTC imodium 1/2 tab to 1 tab prn -Avoid food triggers -Revisited  pelvic floor therapy, patient declined not covered by insurance -follow-up 3 mths with APP  Thank you for the courtesy of this consult. Please call me with any questions or concerns.   Brando Taves, FNP-C [facility] 11/09/2023, 6:44 AM  Cc: Melvin, Jon K, MD

## 2023-11-09 NOTE — Patient Instructions (Addendum)
 Diarrhea Recommend taking 3 psyllium husk per day for 2 weeks If you need to can increase to 4 tablets po daily, do for another 1-2 weeks Max 5 tablets po daily   If this helps but you still have concerns with going out Can take OTC imodium 1/2 tablet or 1 tablet if going out to events or traveling  Internal hemorrhoids Can use the topical ointment as needed No straining Continue high fiber diet  _______________________________________________________  If your blood pressure at your visit was 140/90 or greater, please contact your primary care physician to follow up on this.  _______________________________________________________  If you are age 85 or older, your body mass index should be between 23-30. Your Body mass index is 30.31 kg/m. If this is out of the aforementioned range listed, please consider follow up with your Primary Care Provider.  If you are age 52 or younger, your body mass index should be between 19-25. Your Body mass index is 30.31 kg/m. If this is out of the aformentioned range listed, please consider follow up with your Primary Care Provider.   ________________________________________________________  The [place] [facility] providers would like to encourage you to use MYCHART to communicate with providers for non-urgent requests or questions.  Due to long hold times on the telephone, sending your provider a message by [facility] may be a faster and more efficient way to get a response.  Please allow 48 business hours for a response.  Please remember that this is for non-urgent requests.  _______________________________________________________  [facility] is using a team-based approach to care.  Your team is made up of your doctor and two to three APPS. Our APPS (Nurse Practitioners and Physician Assistants) work with your physician to ensure care continuity for you. They are fully qualified to address your health concerns and develop a treatment plan. They  communicate directly with your gastroenterologist to care for you. Seeing the Advanced Practice Practitioners on your physician's team can help you by facilitating care more promptly, often allowing for earlier appointments, access to diagnostic testing, procedures, and other specialty referrals.    Thank you for trusting me with your gastrointestinal care. Deanna May, FNP-C

## 2024-01-09 ENCOUNTER — Encounter: Payer: Self-pay | Admitting: Gastroenterology

## 2024-01-09 ENCOUNTER — Ambulatory Visit (INDEPENDENT_AMBULATORY_CARE_PROVIDER_SITE_OTHER): Admitting: Gastroenterology

## 2024-01-09 VITALS — BP 126/72 | HR 62 | Ht 66.0 in | Wt 186.4 lb

## 2024-01-09 DIAGNOSIS — R194 Change in bowel habit: Secondary | ICD-10-CM

## 2024-01-09 DIAGNOSIS — R152 Fecal urgency: Secondary | ICD-10-CM

## 2024-01-09 DIAGNOSIS — R159 Full incontinence of feces: Secondary | ICD-10-CM | POA: Diagnosis not present

## 2024-01-09 DIAGNOSIS — K648 Other hemorrhoids: Secondary | ICD-10-CM | POA: Diagnosis not present

## 2024-01-09 MED ORDER — HYDROCORTISONE (PERIANAL) 2.5 % EX CREA: 1.0000 | TOPICAL_CREAM | Freq: Two times a day (BID) | CUTANEOUS | 1 refills | Status: AC

## 2024-01-09 NOTE — Progress Notes (Signed)
 Chief Complaint: follow up on chronic diarrhea Primary GI Doctor: Dr. Charlanne  HPI:  Patient is a 83 year old female patient with past medical history ofn GERD, hypertension, basal cell cancer, A-fib, who was referred to me by Seena Thom POUR, MD on 06/15/2023 for a complaint of chronic diarrhea.   Last seen by myself on 11/09/23 for chronic diarrhea.  10/27/23 ED visit for rectal bleeding. Exam showed external hemorrhoids. Prescribed phenylephrine  ointment. No further workup.  Interval History     Patient presents for follow-up on chronic diarrhea. She is currently taking psyllium 4 capsules po daily. She uses OTC imodium prn. She reports when she takes the imodium it causes her to get stopped up.No abd pain or bloating. She has had few episodes of stool leakage.      She has not any any issues with external hemorrhoids since her ED visit back in August.   We discussed surgical options on multiple occasions, she is not interested at this time.  She reports last colonoscopy was over 15 years ago and normal.   Patient taking Coumadin  for afib.   Patient's family history includes mother with liver CA.  Wt Readings from Last 3 Encounters:  01/09/24 186 lb 6 oz (84.5 kg)  11/09/23 187 lb 12.8 oz (85.2 kg)  09/01/23 189 lb 2 oz (85.8 kg)    Past Medical History:  Diagnosis Date   Arthritis    right hip   Cancer (HCC)    basal cell skin cancer   Cataract bilateral   Chronic kidney disease    GERD (gastroesophageal reflux disease)    Hypertension    OSA (obstructive sleep apnea)    cpap used nightly   Paroxysmal atrial fibrillation (HCC)    Dr. Von liming   Peripheral neuropathy    bilateral feet   Raynaud's syndrome    Raynaud's syndrome    fingers and toes    Past Surgical History:  Procedure Laterality Date   ABDOMINAL HYSTERECTOMY     CATARACT EXTRACTION, BILATERAL     LOWER EXTREMITY VENOUS DOPPLER  11/18/2010   No evidence of thrombus or thrombophlebitis. No venous  insufficiency was noted.   TOTAL HIP ARTHROPLASTY Right 12/31/2015   Procedure: RIGHT TOTAL HIP ARTHROPLASTY ANTERIOR APPROACH;  Surgeon: Redell Shoals, MD;  Location: [facility];  Service: Orthopedics;  Laterality: Right;  Needs RNFA   TOTAL HIP ARTHROPLASTY Left 11/25/2021   Procedure: TOTAL HIP ARTHROPLASTY ANTERIOR APPROACH;  Surgeon: Shoals Redell, MD;  Location: [facility];  Service: Orthopedics;  Laterality: Left;    Current Outpatient Medications  Medication Sig Dispense Refill   cholecalciferol  (VITAMIN D3) 25 MCG (1000 UNIT) tablet Take 1,000 Units by mouth daily.     clobetasol cream (TEMOVATE) 0.05 % Apply 1 Application topically daily as needed for rash.     cyanocobalamin (VITAMIN B12) 1000 MCG/ML injection Inject 1,000 mcg into the skin every 30 (thirty) days.     diclofenac Sodium (VOLTAREN) 1 % GEL Apply 2 g topically daily as needed (pain).     ELIQUIS 2.5 MG TABS tablet Take 2.5 mg by mouth 2 (two) times daily.     febuxostat  (ULORIC ) 40 MG tablet Take 40 mg by mouth daily.     fluticasone  (FLONASE ) 50 MCG/ACT nasal spray Place 2 sprays into both nostrils daily as needed for allergies.     gabapentin  (NEURONTIN ) 600 MG tablet Take 600 mg by mouth in the morning, at noon, in the evening, and at bedtime.  hydrocortisone (ANUSOL-HC) 2.5 % rectal cream Place 1 Application rectally 2 (two) times daily. 30 g 1   lovastatin (MEVACOR) 40 MG tablet Take 40 mg by mouth daily.  3   metoprolol  succinate (TOPROL -XL) 100 MG 24 hr tablet Take 100 mg by mouth at bedtime.      NON FORMULARY Pt uses a cpap nightly     oxybutynin  (DITROPAN -XL) 5 MG 24 hr tablet Take 5 mg by mouth daily.     OXYGEN Inhale 2 L into the lungs at bedtime.     pantoprazole  (PROTONIX ) 40 MG tablet Take 40 mg by mouth daily.     triamterene -hydrochlorothiazide  (MAXZIDE -25) 37.5-25 MG per tablet Take 1 tablet by mouth daily.     No current facility-administered medications for this visit.    Allergies as of  01/09/2024 - Review Complete 01/09/2024  Allergen Reaction Noted   Augmentin [amoxicillin-pot clavulanate] Diarrhea 11/25/2021    Family History  Problem Relation Age of Onset   Liver cancer Mother    Alzheimer's disease Father    Diabetes Sister    Diabetes Maternal Grandfather     Review of Systems:    Constitutional: No weight loss, fever, chills, weakness or fatigue HEENT: Eyes: No change in vision               Ears, Nose, Throat:  No change in hearing or congestion Skin: No rash or itching Cardiovascular: No chest pain, chest pressure or palpitations   Respiratory: No SOB or cough Gastrointestinal: See HPI and otherwise negative Genitourinary: No dysuria or change in urinary frequency Neurological: No headache, dizziness or syncope Musculoskeletal: No new muscle or joint pain Hematologic: No bleeding or bruising Psychiatric: No history of depression or anxiety    Physical Exam:  Vital signs: BP 126/72   Pulse 62   Ht 5' 6 (1.676 m)   Wt 186 lb 6 oz (84.5 kg)   SpO2 98%   BMI 30.08 kg/m   Constitutional:  Pleasant  female appears to be in NAD, Well developed, Well nourished, alert and cooperative Throat: Oral cavity and pharynx without inflammation, swelling or lesion.  Respiratory: Respirations even and unlabored. Lungs clear to auscultation bilaterally.   No wheezes, crackles, or rhonchi.  Cardiovascular: Normal S1, S2. Regular rate and rhythm. No peripheral edema, cyanosis or pallor.  Gastrointestinal:  Soft, nondistended, nontender. No rebound or guarding. Normal bowel sounds. No appreciable masses or hepatomegaly. Rectal:  Not performed.  Msk:  Symmetrical without gross deformities. Without edema, no deformity or joint abnormality.  Neurologic:  Alert and  oriented x4;  grossly normal neurologically.  Skin:   Dry and intact without significant lesions or rashes. Psychiatric: Oriented to person, place and time. Demonstrates good judgement and reason without  abnormal affect or behaviors.  RELEVANT LABS AND IMAGING: CBC    Latest Ref Rng & Units 11/26/2021    3:45 AM 11/25/2021    4:11 PM 11/12/2021   11:37 AM  CBC  WBC 4.0 - 10.5 K/uL 12.4  15.0  8.9   Hemoglobin 12.0 - 15.0 g/dL 9.6  88.4  86.9   Hematocrit 36.0 - 46.0 % 30.1  37.2  41.8   Platelets 150 - 400 K/uL 223  258  326      CMP     Latest Ref Rng & Units 11/26/2021    3:45 AM 11/25/2021    4:11 PM 11/12/2021   11:37 AM  CMP  Glucose 70 - 99 mg/dL 859   93  BUN 8 - 23 mg/dL 20   22   Creatinine 9.55 - 1.00 mg/dL 8.88  8.98  8.70   Sodium 135 - 145 mmol/L 140   140   Potassium 3.5 - 5.1 mmol/L 3.7   4.3   Chloride 98 - 111 mmol/L 109   105   CO2 22 - 32 mmol/L 27   29   Calcium 8.9 - 10.3 mg/dL 8.3   9.6      Lab Results  Component Value Date   TSH 4.200 04/23/2015    09/01/23 Abd xray 2 view- IMPRESSION: Small volume of formed stool in the colon. No bowel obstruction.   Assessment: Encounter Diagnoses  Name Primary?   Incontinence of feces with fecal urgency Yes   Internal hemorrhoids    Altered bowel habits      83 year old female patient presents for follow-up on chronic diarrhea. Overall she is doing much better with current regimen.   Patient follows high-fiber diet and takes daily fiber capsules, cannot tolerate powder.  We did discuss she could use over-the-counter Imodium half to full tablet as needed as needed. We also revisited pelvic floor therapy however she states that she does not believe it is covered with her insurance.    For patient's external hemorrhoids we discussed using the topical ointment as needed.  Patient reminded what symptoms to report and or if the bleeding reoccurs.  We discussed surgical options however patient declines at this time.  Plan: - Continue High fiber diet -No straining or pushing during bowel movements -use Anusol ointment prn hemorrhoids - we discussed surgical options, patient declined  -Continue psyllium husk 4  capsules daily -OTC imodium 1/2 tab to 1 tab prn -Avoid food triggers -Revisited  pelvic floor therapy, patient declined not covered by insurance -follow-up in 6 mths with Dr. Charlanne  Thank you for the courtesy of this consult. Please call me with any questions or concerns.   Gabreille Dardis, FNP-C [facility] 01/09/2024, 10:27 AM  Cc: Melvin, Jon K, MD

## 2024-01-09 NOTE — Patient Instructions (Addendum)
 Recommend high fiber diet No straining or pushing during bowel movements Use Anusol ointment prn hemorrhoids Continue psyllium husk 4 capsules daily OTC imodium 1/2 tab to 1 tab prn diarrhea  _______________________________________________________  If your blood pressure at your visit was 140/90 or greater, please contact your primary care physician to follow up on this.  _______________________________________________________  If you are age 85 or older, your body mass index should be between 23-30. Your Body mass index is 30.08 kg/m. If this is out of the aforementioned range listed, please consider follow up with your Primary Care Provider.  If you are age 55 or younger, your body mass index should be between 19-25. Your Body mass index is 30.08 kg/m. If this is out of the aformentioned range listed, please consider follow up with your Primary Care Provider.   ________________________________________________________  The [place] [facility] providers would like to encourage you to use MYCHART to communicate with providers for non-urgent requests or questions.  Due to long hold times on the telephone, sending your provider a message by [facility] may be a faster and more efficient way to get a response.  Please allow 48 business hours for a response.  Please remember that this is for non-urgent requests.  _______________________________________________________  [facility] is using a team-based approach to care.  Your team is made up of your doctor and two to three APPS. Our APPS (Nurse Practitioners and Physician Assistants) work with your physician to ensure care continuity for you. They are fully qualified to address your health concerns and develop a treatment plan. They communicate directly with your gastroenterologist to care for you. Seeing the Advanced Practice Practitioners on your physician's team can help you by facilitating care more promptly, often allowing for earlier  appointments, access to diagnostic testing, procedures, and other specialty referrals.   Thank you for trusting me with your gastrointestinal care. Deanna May, FNP-C
